# Patient Record
Sex: Male | Born: 1955 | Race: Black or African American | Hispanic: No | Smoking: Never smoker
Health system: Southern US, Community
[De-identification: ages and names within clinical notes are randomized; demographics above are authoritative.]

## PROBLEM LIST (undated history)

## (undated) DIAGNOSIS — N200 Calculus of kidney: Secondary | ICD-10-CM

## (undated) DIAGNOSIS — M5136 Other intervertebral disc degeneration, lumbar region: Secondary | ICD-10-CM

## (undated) DIAGNOSIS — G894 Chronic pain syndrome: Secondary | ICD-10-CM

## (undated) DIAGNOSIS — M51369 Other intervertebral disc degeneration, lumbar region without mention of lumbar back pain or lower extremity pain: Secondary | ICD-10-CM

## (undated) DIAGNOSIS — L309 Dermatitis, unspecified: Secondary | ICD-10-CM

## (undated) DIAGNOSIS — E785 Hyperlipidemia, unspecified: Secondary | ICD-10-CM

## (undated) HISTORY — DX: Hyperlipidemia, unspecified: E78.5

## (undated) HISTORY — PX: LITHOTRIPSY: SUR834

## (undated) HISTORY — PX: CIRCUMCISION: SUR203

---

## 2010-03-24 ENCOUNTER — Emergency Department (HOSPITAL_BASED_OUTPATIENT_CLINIC_OR_DEPARTMENT_OTHER): Admission: EM | Admit: 2010-03-24 | Discharge: 2010-03-24 | Payer: Self-pay | Admitting: Emergency Medicine

## 2010-03-24 ENCOUNTER — Ambulatory Visit: Payer: Self-pay | Admitting: Diagnostic Radiology

## 2010-04-29 ENCOUNTER — Emergency Department (HOSPITAL_BASED_OUTPATIENT_CLINIC_OR_DEPARTMENT_OTHER): Admission: EM | Admit: 2010-04-29 | Discharge: 2010-04-30 | Payer: Self-pay | Admitting: Emergency Medicine

## 2010-04-30 ENCOUNTER — Ambulatory Visit: Payer: Self-pay | Admitting: Diagnostic Radiology

## 2012-05-25 ENCOUNTER — Emergency Department (HOSPITAL_BASED_OUTPATIENT_CLINIC_OR_DEPARTMENT_OTHER): Payer: Self-pay

## 2012-05-25 ENCOUNTER — Emergency Department (HOSPITAL_BASED_OUTPATIENT_CLINIC_OR_DEPARTMENT_OTHER)
Admission: EM | Admit: 2012-05-25 | Discharge: 2012-05-25 | Disposition: A | Discharge status: Home / Self Care | Payer: Self-pay | Attending: Emergency Medicine | Admitting: Emergency Medicine

## 2012-05-25 ENCOUNTER — Encounter (HOSPITAL_BASED_OUTPATIENT_CLINIC_OR_DEPARTMENT_OTHER): Payer: Self-pay | Admitting: Emergency Medicine

## 2012-05-25 DIAGNOSIS — R079 Chest pain, unspecified: Secondary | ICD-10-CM | POA: Insufficient documentation

## 2012-05-25 DIAGNOSIS — M62838 Other muscle spasm: Secondary | ICD-10-CM | POA: Insufficient documentation

## 2012-05-25 LAB — BASIC METABOLIC PANEL
CO2: 25 mEq/L (ref 19–32)
Chloride: 102 mEq/L (ref 96–112)
Glucose, Bld: 180 mg/dL — ABNORMAL HIGH (ref 70–99)

## 2012-05-25 LAB — CBC WITH DIFFERENTIAL/PLATELET
Basophils Relative: 0 % (ref 0–1)
Eosinophils Absolute: 0.2 10*3/uL (ref 0.0–0.7)
Hemoglobin: 13.9 g/dL (ref 13.0–17.0)
Lymphocytes Relative: 34 % (ref 12–46)
MCH: 30.4 pg (ref 26.0–34.0)
MCHC: 35.1 g/dL (ref 30.0–36.0)
MCV: 86.7 fL (ref 78.0–100.0)
Monocytes Absolute: 0.4 10*3/uL (ref 0.1–1.0)
Neutrophils Relative %: 55 % (ref 43–77)
Platelets: 231 10*3/uL (ref 150–400)
WBC: 6.1 10*3/uL (ref 4.0–10.5)

## 2012-05-25 LAB — TROPONIN I: Troponin I: <0.3 ng/mL (ref <0.30)

## 2012-05-25 MED ORDER — IBUPROFEN 800 MG PO TABS
800.0000 mg | ORAL_TABLET | Freq: Once | ORAL | Status: AC
  Administered 2012-05-25: 800 mg via ORAL
  Filled 2012-05-25: qty 1

## 2012-05-25 MED ORDER — CYCLOBENZAPRINE HCL 10 MG PO TABS: 10.0000 mg | ORAL_TABLET | Freq: Three times a day (TID) | ORAL | Status: AC | PRN

## 2012-05-25 MED ORDER — ASPIRIN 81 MG PO CHEW
324.0000 mg | CHEWABLE_TABLET | Freq: Once | ORAL | Status: AC
  Administered 2012-05-25: 324 mg via ORAL
  Filled 2012-05-25: qty 4

## 2012-05-25 MED ORDER — IBUPROFEN 800 MG PO TABS: 800.0000 mg | ORAL_TABLET | Freq: Three times a day (TID) | ORAL | Status: AC | PRN

## 2012-05-25 NOTE — ED Notes (Signed)
Pt c/o right sided rib pain, radiating to right scapula.  Some shortness of breath.  Pt states pain worsens with movement.  No N/V or diaphoresis.  No fever.  No cough.

## 2012-05-25 NOTE — ED Provider Notes (Signed)
History     CSN: [id number]  Arrival date & time 05/25/12  4098   First MD Initiated Contact with Patient 05/25/12 0940      Chief Complaint  Patient presents with  . Chest Pain  . Shortness of Breath    (Consider location/radiation/quality/duration/timing/severity/associated sxs/prior treatment) HPI Pt reports about a week of moderate aching R sided chest pain, radiating around to R scapula and R neck, no known injury. No SOB, nausea or diaphoresis. No cough or fever. No PE or CAD risk factors.   History reviewed. No pertinent past medical history.  History reviewed. No pertinent past surgical history.  History reviewed. No pertinent family history.  History  Substance Use Topics  . Smoking status: Never Smoker   . Smokeless tobacco: Not on file  . Alcohol Use:       Review of Systems All other systems reviewed and are negative except as noted in HPI.   Allergies  Vicodin  Home Medications  No current outpatient prescriptions on file.  BP 140/74  Pulse 68  Resp 20  Ht 5\' 9"  (1.753 m)  Wt 190 lb (86.183 kg)  BMI 28.06 kg/m2  SpO2 99%  Physical Exam  Nursing note and vitals reviewed. Constitutional: He is oriented to person, place, and time. He appears well-developed and well-nourished.  HENT:  Head: Normocephalic and atraumatic.  Eyes: EOM are normal. Pupils are equal, round, and reactive to light.  Neck: Normal range of motion. Neck supple.  Cardiovascular: Normal rate, normal heart sounds and intact distal pulses.   Pulmonary/Chest: Effort normal and breath sounds normal. He exhibits tenderness.    Abdominal: Bowel sounds are normal. He exhibits no distension. There is no tenderness.  Musculoskeletal: Normal range of motion. He exhibits no edema and no tenderness.  Neurological: He is alert and oriented to person, place, and time. He has normal strength. No cranial nerve deficit or sensory deficit.  Skin: Skin is warm and dry. No rash noted.    Psychiatric: He has a normal mood and affect.    ED Course  Procedures (including critical care time)   Labs Reviewed  CBC WITH DIFFERENTIAL  BASIC METABOLIC PANEL  TROPONIN I   No results found.   No diagnosis found.    MDM   Date: 05/25/2012  Rate: 65  Rhythm: sinus  QRS Axis: normal  Intervals: normal  ST/T Wave abnormalities: nonspecific T wave changes  Conduction Disutrbances:first-degree A-V block   Narrative Interpretation:   Old EKG Reviewed: none available  11:23 AM Pt pain returned and is now clearly related to muscle spasm in his R trapezius. 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 Suspect this is an over use spasm. No concern for cardiac or pulmonary process.         [name] B. Bernette Mayers, MD 05/25/12 1124

## 2014-04-14 ENCOUNTER — Encounter (HOSPITAL_BASED_OUTPATIENT_CLINIC_OR_DEPARTMENT_OTHER): Payer: Self-pay | Admitting: Emergency Medicine

## 2014-04-14 ENCOUNTER — Emergency Department (HOSPITAL_BASED_OUTPATIENT_CLINIC_OR_DEPARTMENT_OTHER): Payer: 59

## 2014-04-14 ENCOUNTER — Emergency Department (HOSPITAL_BASED_OUTPATIENT_CLINIC_OR_DEPARTMENT_OTHER)
Admission: EM | Admit: 2014-04-14 | Discharge: 2014-04-14 | Disposition: A | Discharge status: Home / Self Care | Payer: 59 | Attending: Emergency Medicine | Admitting: Emergency Medicine

## 2014-04-14 DIAGNOSIS — M25519 Pain in unspecified shoulder: Secondary | ICD-10-CM | POA: Insufficient documentation

## 2014-04-14 DIAGNOSIS — R52 Pain, unspecified: Secondary | ICD-10-CM | POA: Insufficient documentation

## 2014-04-14 DIAGNOSIS — M25512 Pain in left shoulder: Secondary | ICD-10-CM

## 2014-04-14 DIAGNOSIS — R209 Unspecified disturbances of skin sensation: Secondary | ICD-10-CM | POA: Insufficient documentation

## 2014-04-14 MED ORDER — HYDROMORPHONE HCL 4 MG PO TABS: 4.0000 mg | ORAL_TABLET | Freq: Four times a day (QID) | ORAL | Status: DC | PRN

## 2014-04-14 MED ORDER — NAPROXEN 500 MG PO TABS: 500.0000 mg | ORAL_TABLET | Freq: Two times a day (BID) | ORAL | Status: DC

## 2014-04-14 MED ORDER — HYDROMORPHONE HCL PF 2 MG/ML IJ SOLN
2.0000 mg | Freq: Once | INTRAMUSCULAR | Status: AC
  Administered 2014-04-14: 2 mg via INTRAMUSCULAR
  Filled 2014-04-14: qty 1
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
## 2014-04-14 NOTE — Discharge Instructions (Signed)
X-rays of the left shoulder show some degenerative changes. No fracture no dislocation. This also could be a rotator cuff injury. Wear the sling at all times for comfort. Can take off to take a shower. Take the Naprosyn on a regular basis. Take the Dilaudid as needed for additional pain. Make an appointment with sports medicine upstairs information provided. Work note provided.

## 2014-04-14 NOTE — ED Provider Notes (Signed)
CSN: [id number]     Arrival date & time 04/14/14  [id number] History   First MD Initiated Contact with Patient 04/14/14 1014     Chief Complaint  Patient presents with  . Shoulder Pain     (Consider location/radiation/quality/duration/timing/severity/associated sxs/prior Treatment) Patient is a 58 y.o. male presenting with shoulder pain. The history is provided by the patient.  Shoulder Pain Pertinent negatives include no chest pain, no abdominal pain, no headaches and no shortness of breath.   patient with acute onset of left shoulder pain last evening. Was lifting a heavy battery but did not hurt at the time started to hurt later. No direct injury to the shoulder. No past history of problems with the shoulder. Pain is 10 out of 10. Pronounced made much worse by any movement of the shoulder at all. Feels as if there is numbness in the upper part of the arm but no numbness to the fingers.  History reviewed. No pertinent past medical history. History reviewed. No pertinent past surgical history. No family history on file. History  Substance Use Topics  . Smoking status: Never Smoker   . Smokeless tobacco: Not on file  . Alcohol Use: No    Review of Systems  Constitutional: Negative for fever.  HENT: Negative for congestion.   Eyes: Negative for visual disturbance.  Respiratory: Negative for shortness of breath.   Cardiovascular: Negative for chest pain.  Gastrointestinal: Negative for abdominal pain.  Musculoskeletal: Positive for arthralgias. Negative for neck pain.  Skin: Negative for rash.  Neurological: Positive for numbness. Negative for headaches.  Hematological: Does not bruise/bleed easily.  Psychiatric/Behavioral: Negative for confusion.      Allergies  Vicodin  Home Medications   Prior to Admission medications   Medication Sig Start Date End Date Taking? Authorizing Provider  HYDROmorphone (DILAUDID) 4 MG tablet Take 1 tablet (4 mg total) by mouth every 6 (six) hours  as needed for severe pain. 04/14/14   Vanetta MuldersScott Saivon Prowse, MD  naproxen (NAPROSYN) 500 MG tablet Take 1 tablet (500 mg total) by mouth 2 (two) times daily. 04/14/14   Vanetta MuldersScott Lakeria Starkman, MD   BP 135/86  Pulse 83  Temp(Src) 98.2 F (36.8 C) (Temporal)  Resp 16  Ht 5\' 9"  (1.753 m)  Wt 186 lb (84.369 kg)  BMI 27.45 kg/m2  SpO2 97% Physical Exam  Nursing note and vitals reviewed. Constitutional: He is oriented to person, place, and time. He appears well-developed and well-nourished. No distress.  HENT:  Head: Normocephalic and atraumatic.  Mouth/Throat: Oropharynx is clear and moist.  Eyes: Conjunctivae and EOM are normal. Pupils are equal, round, and reactive to light.  Neck: Normal range of motion.  Cardiovascular: Normal rate and normal heart sounds.   No murmur heard. Pulmonary/Chest: Breath sounds normal. No respiratory distress.  Abdominal: Soft. Bowel sounds are normal. There is no tenderness.  Musculoskeletal: Normal range of motion. He exhibits no edema.  Left shoulder without evidence of dislocation. Radial pulses 2+. No numbness to the fingers. Increased pain with any kind of range of motion of the shoulder. Patient states that the shoulder feels hot but to palpation shoulder is not abnormally warm. No erythema.  Neurological: He is alert and oriented to person, place, and time. No cranial nerve deficit. He exhibits normal muscle tone. Coordination normal.  Skin: Skin is warm. No rash noted.    ED Course  Procedures (including critical care time) Labs Review Labs Reviewed - No data to display  Imaging Review Dg Shoulder Left  04/14/2014   CLINICAL DATA:  Pain.  EXAM: LEFT SHOULDER - 2+ VIEW  COMPARISON:  None.  FINDINGS: Acromioclavicular and glenohumeral degenerative change. No fracture, separation, or dislocation.  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   Electronically Signed   By: Maisie Fushomas  Register   On: 04/14/2014 10:19     EKG Interpretation None      MDM   Final  diagnoses:  Shoulder pain, acute, left    Patient's shoulder exam suggestive of rotator cuff injury or either arthritic changes in the shoulder. With inflammation. X-ray shows no fracture or dislocation. Does show some degenerative changes. This may be contributory. Patient improved with pain medicine here and a left arm sling. We'll give referral to sports medicine. For further followup. No neurovascular deficits.    Vanetta MuldersScott Julie Paolini, MD 04/14/14 1119

## 2014-06-11 ENCOUNTER — Emergency Department (HOSPITAL_BASED_OUTPATIENT_CLINIC_OR_DEPARTMENT_OTHER)
Admission: EM | Admit: 2014-06-11 | Discharge: 2014-06-11 | Disposition: A | Discharge status: Home / Self Care | Payer: 59 | Attending: Emergency Medicine | Admitting: Emergency Medicine

## 2014-06-11 ENCOUNTER — Encounter (HOSPITAL_BASED_OUTPATIENT_CLINIC_OR_DEPARTMENT_OTHER): Payer: Self-pay | Admitting: Emergency Medicine

## 2014-06-11 ENCOUNTER — Emergency Department (HOSPITAL_BASED_OUTPATIENT_CLINIC_OR_DEPARTMENT_OTHER): Payer: 59

## 2014-06-11 DIAGNOSIS — S4980XA Other specified injuries of shoulder and upper arm, unspecified arm, initial encounter: Secondary | ICD-10-CM | POA: Insufficient documentation

## 2014-06-11 DIAGNOSIS — S46909A Unspecified injury of unspecified muscle, fascia and tendon at shoulder and upper arm level, unspecified arm, initial encounter: Secondary | ICD-10-CM | POA: Diagnosis present

## 2014-06-11 DIAGNOSIS — Y9389 Activity, other specified: Secondary | ICD-10-CM | POA: Diagnosis not present

## 2014-06-11 DIAGNOSIS — S40019A Contusion of unspecified shoulder, initial encounter: Secondary | ICD-10-CM | POA: Insufficient documentation

## 2014-06-11 DIAGNOSIS — S40011A Contusion of right shoulder, initial encounter: Secondary | ICD-10-CM

## 2014-06-11 DIAGNOSIS — Y92410 Unspecified street and highway as the place of occurrence of the external cause: Secondary | ICD-10-CM | POA: Insufficient documentation

## 2014-06-11 MED ORDER — TRAMADOL HCL 50 MG PO TABS: 50.0000 mg | ORAL_TABLET | Freq: Four times a day (QID) | ORAL | Status: DC | PRN

## 2014-06-11 MED ORDER — NAPROXEN 500 MG PO TABS: 500.0000 mg | ORAL_TABLET | Freq: Two times a day (BID) | ORAL | Status: DC
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
## 2014-06-16 ENCOUNTER — Encounter: Payer: Self-pay | Admitting: Family Medicine

## 2014-06-16 ENCOUNTER — Ambulatory Visit (HOSPITAL_BASED_OUTPATIENT_CLINIC_OR_DEPARTMENT_OTHER)
Admission: RE | Admit: 2014-06-16 | Discharge: 2014-06-16 | Disposition: A | Discharge status: Home / Self Care | Payer: 59 | Source: Ambulatory Visit | Attending: Family Medicine | Admitting: Family Medicine

## 2014-06-16 ENCOUNTER — Ambulatory Visit (INDEPENDENT_AMBULATORY_CARE_PROVIDER_SITE_OTHER): Payer: 59 | Admitting: Family Medicine

## 2014-06-16 VITALS — BP 118/77 | HR 67 | Ht 69.0 in | Wt 190.0 lb

## 2014-06-16 DIAGNOSIS — R079 Chest pain, unspecified: Secondary | ICD-10-CM

## 2014-06-16 DIAGNOSIS — R071 Chest pain on breathing: Secondary | ICD-10-CM | POA: Insufficient documentation

## 2014-06-16 DIAGNOSIS — R0789 Other chest pain: Secondary | ICD-10-CM

## 2014-06-16 DIAGNOSIS — R0781 Pleurodynia: Secondary | ICD-10-CM

## 2014-06-16 DIAGNOSIS — S4992XA Unspecified injury of left shoulder and upper arm, initial encounter: Secondary | ICD-10-CM

## 2014-06-16 DIAGNOSIS — R0602 Shortness of breath: Secondary | ICD-10-CM | POA: Diagnosis not present

## 2014-06-16 DIAGNOSIS — S46909A Unspecified injury of unspecified muscle, fascia and tendon at shoulder and upper arm level, unspecified arm, initial encounter: Secondary | ICD-10-CM

## 2014-06-16 DIAGNOSIS — S4980XA Other specified injuries of shoulder and upper arm, unspecified arm, initial encounter: Secondary | ICD-10-CM

## 2014-06-16 MED ORDER — NAPROXEN 500 MG PO TABS: 500.0000 mg | ORAL_TABLET | Freq: Two times a day (BID) | ORAL | Status: DC

## 2014-06-16 MED ORDER — OXYCODONE-ACETAMINOPHEN 5-325 MG PO TABS: 1.0000 | ORAL_TABLET | Freq: Four times a day (QID) | ORAL | Status: DC | PRN

## 2014-06-16 NOTE — Patient Instructions (Addendum)
Use the sling regularly for the next couple weeks. But come out of this a couple times a day to move your shoulder and elbow a little bit. Naproxen  twice a day with food for pain and inflammation. Percocet as needed for severe pain. Follow up with me in 2 weeks for reevaluation. If not improving will consider MRI of your shoulder to assess for full thickness rotator cuff tear. Icing 15 minutes at a time 3-4 times a day. If shortness of breath worsens go to the emergency department.

## 2014-06-18 ENCOUNTER — Encounter: Payer: Self-pay | Admitting: Family Medicine

## 2014-06-18 DIAGNOSIS — R0781 Pleurodynia: Secondary | ICD-10-CM | POA: Insufficient documentation

## 2014-06-18 DIAGNOSIS — S4992XA Unspecified injury of left shoulder and upper arm, initial encounter: Secondary | ICD-10-CM | POA: Insufficient documentation

## 2014-06-18 NOTE — Assessment & Plan Note (Signed)
likely 2/2 contusion, rotator cuff strain.  Partial supraspinatus tear possible.  Start with conservative treatment.  Naproxen with percocet as needed.  Icing, sling.  ROM exercises.  F/u in 2 weeks for reevaluation.

## 2014-06-18 NOTE — Assessment & Plan Note (Signed)
chest x-rays negative for pneumothorax, rib fracture.  2/2 contusion.  Reassured.  Icing, naproxen with percocet as needed.  Discussed red flags - go to ED with worsening shortness of breath, chest pain, hemoptysis.
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
## 2014-06-29 ENCOUNTER — Encounter: Payer: Self-pay | Admitting: Family Medicine

## 2014-06-29 ENCOUNTER — Ambulatory Visit: Payer: 59 | Admitting: Family Medicine

## 2014-06-29 ENCOUNTER — Ambulatory Visit (INDEPENDENT_AMBULATORY_CARE_PROVIDER_SITE_OTHER): Payer: 59 | Admitting: Family Medicine

## 2014-06-29 VITALS — BP 126/78 | HR 73 | Ht 69.0 in | Wt 190.0 lb

## 2014-06-29 DIAGNOSIS — S46909A Unspecified injury of unspecified muscle, fascia and tendon at shoulder and upper arm level, unspecified arm, initial encounter: Secondary | ICD-10-CM

## 2014-06-29 DIAGNOSIS — Z5189 Encounter for other specified aftercare: Secondary | ICD-10-CM

## 2014-06-29 DIAGNOSIS — S4980XA Other specified injuries of shoulder and upper arm, unspecified arm, initial encounter: Secondary | ICD-10-CM

## 2014-06-29 DIAGNOSIS — S4992XD Unspecified injury of left shoulder and upper arm, subsequent encounter: Secondary | ICD-10-CM

## 2014-06-29 DIAGNOSIS — R0781 Pleurodynia: Secondary | ICD-10-CM

## 2014-06-29 DIAGNOSIS — R079 Chest pain, unspecified: Secondary | ICD-10-CM

## 2014-06-29 NOTE — Patient Instructions (Signed)
I'm concerned you have a left rotator cuff tear. Get the MRI of your shoulder - I will call you with MRI results the business day following this. Continue naproxen, icing, percocet, sling in meantime. Out of work for 4 weeks.

## 2014-06-30 ENCOUNTER — Ambulatory Visit: Payer: 59 | Admitting: Family Medicine

## 2014-06-30 ENCOUNTER — Ambulatory Visit
Admission: RE | Admit: 2014-06-30 | Discharge: 2014-06-30 | Disposition: A | Discharge status: Home / Self Care | Payer: 59 | Source: Ambulatory Visit | Attending: Family Medicine | Admitting: Family Medicine

## 2014-06-30 DIAGNOSIS — S4992XD Unspecified injury of left shoulder and upper arm, subsequent encounter: Secondary | ICD-10-CM

## 2014-07-01 ENCOUNTER — Encounter: Payer: Self-pay | Admitting: Family Medicine
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
## 2014-07-01 NOTE — Assessment & Plan Note (Signed)
chest x-rays negative for pneumothorax, rib fracture.  Chest/lung exam reassuring again today.  2/2 contusion.  Icing, naproxen with percocet as needed.  Discussed red flags - go to ED with worsening shortness of breath, chest pain, hemoptysis.

## 2014-07-01 NOTE — Assessment & Plan Note (Signed)
likely 2/2 contusion, rotator cuff strain.  Concerning he has not had improvement and his motion is worse than last visit.  Will go ahead with MRI to assess for rotator cuff tear.  Out of work for 4 weeks.

## 2014-07-02 NOTE — Addendum Note (Signed)
Addended by: Kathi Simpers F on: 07/02/2014 04:52 PM   Modules accepted: Orders

## 2014-07-14 ENCOUNTER — Ambulatory Visit: Payer: Self-pay | Attending: Family Medicine | Admitting: Physical Therapy

## 2014-07-14 DIAGNOSIS — M25512 Pain in left shoulder: Secondary | ICD-10-CM | POA: Insufficient documentation

## 2014-07-14 DIAGNOSIS — Z5189 Encounter for other specified aftercare: Secondary | ICD-10-CM | POA: Insufficient documentation

## 2014-07-21 ENCOUNTER — Ambulatory Visit: Payer: Self-pay | Admitting: Physical Therapy

## 2014-07-28 ENCOUNTER — Ambulatory Visit: Payer: Self-pay | Admitting: Rehabilitation

## 2014-07-30 ENCOUNTER — Ambulatory Visit: Payer: Self-pay | Admitting: Rehabilitation

## 2014-08-04 ENCOUNTER — Ambulatory Visit: Payer: Self-pay | Admitting: Rehabilitation

## 2014-08-14 ENCOUNTER — Emergency Department (HOSPITAL_BASED_OUTPATIENT_CLINIC_OR_DEPARTMENT_OTHER)
Admission: EM | Admit: 2014-08-14 | Discharge: 2014-08-14 | Disposition: A | Discharge status: Home / Self Care | Payer: No Typology Code available for payment source | Attending: Emergency Medicine | Admitting: Emergency Medicine

## 2014-08-14 ENCOUNTER — Emergency Department (HOSPITAL_BASED_OUTPATIENT_CLINIC_OR_DEPARTMENT_OTHER): Payer: No Typology Code available for payment source

## 2014-08-14 ENCOUNTER — Encounter (HOSPITAL_BASED_OUTPATIENT_CLINIC_OR_DEPARTMENT_OTHER): Payer: Self-pay | Admitting: *Deleted

## 2014-08-14 DIAGNOSIS — Y92410 Unspecified street and highway as the place of occurrence of the external cause: Secondary | ICD-10-CM | POA: Diagnosis not present

## 2014-08-14 DIAGNOSIS — S199XXA Unspecified injury of neck, initial encounter: Secondary | ICD-10-CM | POA: Insufficient documentation

## 2014-08-14 DIAGNOSIS — Z791 Long term (current) use of non-steroidal anti-inflammatories (NSAID): Secondary | ICD-10-CM | POA: Diagnosis not present

## 2014-08-14 DIAGNOSIS — R52 Pain, unspecified: Secondary | ICD-10-CM

## 2014-08-14 DIAGNOSIS — Y9389 Activity, other specified: Secondary | ICD-10-CM | POA: Diagnosis not present

## 2014-08-14 DIAGNOSIS — M542 Cervicalgia: Secondary | ICD-10-CM

## 2014-08-14 MED ORDER — IBUPROFEN 800 MG PO TABS: 800.0000 mg | ORAL_TABLET | Freq: Three times a day (TID) | ORAL | Status: AC

## 2014-08-14 MED ORDER — OXYCODONE-ACETAMINOPHEN 5-325 MG PO TABS: 1.0000 | ORAL_TABLET | Freq: Three times a day (TID) | ORAL | Status: DC | PRN

## 2014-08-14 MED ORDER — KETOROLAC TROMETHAMINE 30 MG/ML IJ SOLN
30.0000 mg | Freq: Once | INTRAMUSCULAR | Status: AC
  Administered 2014-08-14: 30 mg via INTRAMUSCULAR
  Filled 2014-08-14: qty 1

## 2014-08-14 MED ORDER — DIAZEPAM 5 MG PO TABS: 5.0000 mg | ORAL_TABLET | Freq: Two times a day (BID) | ORAL | Status: AC
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
## 2014-08-14 NOTE — Discharge Instructions (Signed)
As discussed, your evaluation tonight has been largely reassuring.  Your pain is likely due to inflammation of the muscles and soft tissue around your neck.    It is important to take all medication as directed, monitor your condition carefully, and do not hesitate to return here if you develop new, or concerning changes in your condition.

## 2015-04-25 ENCOUNTER — Emergency Department (HOSPITAL_BASED_OUTPATIENT_CLINIC_OR_DEPARTMENT_OTHER): Payer: 59

## 2015-04-25 ENCOUNTER — Emergency Department (HOSPITAL_BASED_OUTPATIENT_CLINIC_OR_DEPARTMENT_OTHER)
Admission: EM | Admit: 2015-04-25 | Discharge: 2015-04-25 | Disposition: A | Discharge status: Home / Self Care | Payer: 59 | Attending: Emergency Medicine | Admitting: Emergency Medicine

## 2015-04-25 ENCOUNTER — Encounter (HOSPITAL_BASED_OUTPATIENT_CLINIC_OR_DEPARTMENT_OTHER): Payer: Self-pay | Admitting: Emergency Medicine

## 2015-04-25 DIAGNOSIS — S43402A Unspecified sprain of left shoulder joint, initial encounter: Secondary | ICD-10-CM | POA: Diagnosis not present

## 2015-04-25 DIAGNOSIS — Y998 Other external cause status: Secondary | ICD-10-CM | POA: Diagnosis not present

## 2015-04-25 DIAGNOSIS — R52 Pain, unspecified: Secondary | ICD-10-CM

## 2015-04-25 DIAGNOSIS — Y929 Unspecified place or not applicable: Secondary | ICD-10-CM | POA: Diagnosis not present

## 2015-04-25 DIAGNOSIS — W19XXXA Unspecified fall, initial encounter: Secondary | ICD-10-CM

## 2015-04-25 DIAGNOSIS — S20212A Contusion of left front wall of thorax, initial encounter: Secondary | ICD-10-CM

## 2015-04-25 DIAGNOSIS — Y9389 Activity, other specified: Secondary | ICD-10-CM | POA: Diagnosis not present

## 2015-04-25 DIAGNOSIS — W11XXXA Fall on and from ladder, initial encounter: Secondary | ICD-10-CM | POA: Insufficient documentation

## 2015-04-25 DIAGNOSIS — S4992XA Unspecified injury of left shoulder and upper arm, initial encounter: Secondary | ICD-10-CM | POA: Diagnosis present

## 2015-04-25 MED ORDER — OXYCODONE-ACETAMINOPHEN 5-325 MG PO TABS
1.0000 | ORAL_TABLET | Freq: Once | ORAL | Status: AC
  Administered 2015-04-25: 1 via ORAL
  Filled 2015-04-25: qty 1

## 2015-04-25 MED ORDER — OXYCODONE-ACETAMINOPHEN 5-325 MG PO TABS: ORAL_TABLET | ORAL | Status: DC

## 2015-04-25 NOTE — ED Notes (Signed)
Pt in c/o L shoulder pain after falling from a ladder x 2 days ago.

## 2015-04-25 NOTE — Discharge Instructions (Signed)
Take percocet for breakthrough pain, do not drink alcohol, drive, care for children or do other critical tasks while taking percocet.  ° °It is very important that you take deep breaths to prevent lung collapse and infection. ° °Take 10 deep breaths every hour to prevent lung collapse. ° °If you develop cough, fever or shortness of breath return immediately to the emergency room. ° °Please follow with your primary care doctor in the next 2 days for a check-up. They must obtain records for further management.  ° °Do not hesitate to return to the Emergency Department for any new, worsening or concerning symptoms.  ° ° °Chest Contusion °A chest contusion is a deep bruise on your chest area. Contusions are the result of an injury that caused bleeding under the skin. A chest contusion may involve bruising of the skin, muscles, or ribs. The contusion may turn blue, purple, or yellow. Minor injuries will give you a painless contusion, but more severe contusions may stay painful and swollen for a few weeks. °CAUSES  °A contusion is usually caused by a blow, trauma, or direct force to an area of the body. °SYMPTOMS  °· Swelling and redness of the injured area. °· Discoloration of the injured area. °· Tenderness and soreness of the injured area. °· Pain. °DIAGNOSIS  °The diagnosis can be made by taking a history and performing a physical exam. An X-ray, CT scan, or MRI may be needed to determine if there were any associated injuries, such as broken bones (fractures) or internal injuries. °TREATMENT  °Often, the best treatment for a chest contusion is resting, icing, and applying cold compresses to the injured area. Deep breathing exercises may be recommended to reduce the risk of pneumonia. Over-the-counter medicines may also be recommended for pain control. °HOME CARE INSTRUCTIONS  °· Put ice on the injured area. °¨ Put ice in a plastic bag. °¨ Place a towel between your skin and the bag. °¨ Leave the ice on for 15-20  minutes, 03-04 times a day. °· Only take over-the-counter or prescription medicines as directed by your caregiver. Your caregiver may recommend avoiding anti-inflammatory medicines (aspirin, ibuprofen, and naproxen) for 48 hours because these medicines may increase bruising. °· Rest the injured area. °· Perform deep-breathing exercises as directed by your caregiver. °· Stop smoking if you smoke. °· Do not lift objects over 5 pounds (2.3 kg) for 3 days or longer if recommended by your caregiver. °SEEK IMMEDIATE MEDICAL CARE IF:  °· You have increased bruising or swelling. °· You have pain that is getting worse. °· You have difficulty breathing. °· You have dizziness, weakness, or fainting. °· You have blood in your urine or stool. °· You cough up or vomit blood. °· Your swelling or pain is not relieved with medicines. °MAKE SURE YOU:  °· Understand these instructions. °· Will watch your condition. °· Will get help right away if you are not doing well or get worse. °Document Released: 06/20/2001 Document Revised: 06/19/2012 Document Reviewed: 03/18/2012 °[employer]® Patient Information ©2015 [employer]. This information is not intended to replace advice given to you by your health care provider. Make sure you discuss any questions you have with your health care provider. ° ° °

## 2015-04-25 NOTE — ED Provider Notes (Signed)
CSN: [id number]     Arrival date & time 04/25/15  1633 History   First MD Initiated Contact with Patient 04/25/15 1734     Chief Complaint  Patient presents with  . Shoulder Pain  . Fall     (Consider location/radiation/quality/duration/timing/severity/associated sxs/prior Treatment) HPI   Blood pressure 134/73, pulse 76, temperature 98.3 F (36.8 C), temperature source Oral, resp. rate 18, height 5\' 9"  (1.753 m), weight 180 lb (81.647 kg), SpO2 100 %.  Stuart Hubbard is a 59 y.o. male complaining of left shoulder and left rib pain status post fall off a ladder 2 days ago. Patient states his feet were at approximately 5 feet when he slipped and fell off a ladder, he was inside he fell onto a wooden coffee table he denies any head trauma, LOC, cervicalgia he endorses exacerbation of pain with deep breathing, he rates his pain at 8 out of 10, no pain medication taken prior to arrival. He denies abdominal pain, difficulty ambulating.  History reviewed. No pertinent past medical history. History reviewed. No pertinent past surgical history. History reviewed. No pertinent family history. History  Substance Use Topics  . Smoking status: Never Smoker   . Smokeless tobacco: Not on file  . Alcohol Use: No    Review of Systems  10 systems reviewed and found to be negative, except as noted in the HPI.   Allergies  Tramadol and Vicodin  Home Medications   Prior to Admission medications   Medication Sig Start Date End Date Taking? Authorizing Provider  oxyCODONE-acetaminophen (PERCOCET/ROXICET) 5-325 MG per tablet 1 to 2 tabs PO q6hrs  PRN for pain 04/25/15   Joni ReiningNicole Branton Einstein, PA-C   BP 134/73 mmHg  Pulse 76  Temp(Src) 98.3 F (36.8 C) (Oral)  Resp 18  Ht 5\' 9"  (1.753 m)  Wt 180 lb (81.647 kg)  BMI 26.57 kg/m2  SpO2 100% Physical Exam  Constitutional: He is oriented to person, place, and time. He appears well-developed and well-nourished. No distress.  HENT:  Head:  Normocephalic.  Eyes: Conjunctivae and EOM are normal.  Cardiovascular: Normal rate.   Pulmonary/Chest: Effort normal and breath sounds normal. No stridor. No respiratory distress. He has no wheezes. He has no rales. He exhibits tenderness.  Abdominal: Soft. Bowel sounds are normal. He exhibits no distension and no mass. There is no tenderness. There is no rebound and no guarding.  Musculoskeletal: Normal range of motion.       Arms: Pt with point tenderness to palpation on the axillary line on the left side, no crepitance, lung sounds are clear to auscultation, patient is also tender to palpation along the left triceps, full range of motion to shoulder, distally neurovascularly intact. Drop arm negative.  Neurological: He is alert and oriented to person, place, and time.  Psychiatric: He has a normal mood and affect.  Nursing note and vitals reviewed.   ED Course  Procedures (including critical care time) Labs Review Labs Reviewed - No data to display  Imaging Review Dg Ribs Unilateral W/chest Left  04/25/2015   CLINICAL DATA:  Fall from ladder 2 days ago with left-sided chest pain, initial encounter  EXAM: LEFT RIBS AND CHEST - 3+ VIEW  COMPARISON:  None.  FINDINGS: Cardiac shadow is within normal limits. The lungs are well aerated bilaterally. No pneumothorax or pleural effusion is noted. No acute rib fracture is identified.  IMPRESSION: No acute abnormality noted.   Electronically Signed   By: Alcide CleverMark  Lukens M.D.   On: 04/25/2015  17:39   Dg Shoulder Left  04/25/2015   CLINICAL DATA:  Larey Seat off ladder striking LEFT side of body on edge of a table 2 days ago, LEFT shoulder pain, LEFT lateral rib pain  EXAM: LEFT SHOULDER - 2+ VIEW  COMPARISON:  06/11/2014  FINDINGS: AC joint alignment normal.  Inferior acromial spur formation unchanged.  Glenohumeral alignment grossly normal.  No acute fracture, dislocation, or bone destruction.  Visualized LEFT ribs intact.  IMPRESSION: No acute abnormalities.    Electronically Signed   By: Ulyses Southward M.D.   On: 04/25/2015 17:37     EKG Interpretation None      MDM   Final diagnoses:  Fall, initial encounter  Rib contusion, left, initial encounter  Shoulder sprain, left, initial encounter   Filed Vitals:   04/25/15 1638  BP: 134/73  Pulse: 76  Temp: 98.3 F (36.8 C)  TempSrc: Oral  Resp: 18  Height:  (1.753 m)  Weight: 180 lb (81.647 kg)  SpO2: 100%    Medications  oxyCODONE-acetaminophen (PERCOCET/ROXICET) 5-325 MG per tablet 1 tablet (not administered)    Stuart Hubbard is a pleasant 59 y.o. male presenting with left axillary and left shoulder pain status post fall from height off a ladder 2 days ago. Patient with normal vital signs, lung sounds are clear to auscultation, rib series if shoulder negative. Patient will be given incentive spirometer, aggressive pain control with Percocet (patient states he has an allergy to Vicodin and tramadol but has had oxycodone in the past without an issue. We've had a specific discussion of return precautions for pneumonia and patient is to follow with his primary care in the next week.  Evaluation does not show pathology that would require ongoing emergent intervention or inpatient treatment. Pt is hemodynamically stable and mentating appropriately. Discussed findings and plan with patient/guardian, who agrees with care plan. All questions answered. Return precautions discussed and outpatient follow up given.   New Prescriptions   OXYCODONE-ACETAMINOPHEN (PERCOCET/ROXICET) 5-325 MG PER TABLET    1 to 2 tabs PO q6hrs  PRN for pain       Wynetta Emery, PA-C 04/25/15 1823  Doug Sou, MD 04/26/15 [phone]

## 2015-10-08 ENCOUNTER — Emergency Department (HOSPITAL_BASED_OUTPATIENT_CLINIC_OR_DEPARTMENT_OTHER): Payer: 59

## 2015-10-08 ENCOUNTER — Encounter (HOSPITAL_BASED_OUTPATIENT_CLINIC_OR_DEPARTMENT_OTHER): Payer: Self-pay | Admitting: *Deleted

## 2015-10-08 ENCOUNTER — Emergency Department (HOSPITAL_BASED_OUTPATIENT_CLINIC_OR_DEPARTMENT_OTHER)
Admission: EM | Admit: 2015-10-08 | Discharge: 2015-10-08 | Disposition: A | Discharge status: Home / Self Care | Payer: 59 | Attending: Emergency Medicine | Admitting: Emergency Medicine

## 2015-10-08 DIAGNOSIS — Z87442 Personal history of urinary calculi: Secondary | ICD-10-CM | POA: Diagnosis not present

## 2015-10-08 DIAGNOSIS — R350 Frequency of micturition: Secondary | ICD-10-CM | POA: Diagnosis not present

## 2015-10-08 DIAGNOSIS — M545 Low back pain, unspecified: Secondary | ICD-10-CM

## 2015-10-08 DIAGNOSIS — R109 Unspecified abdominal pain: Secondary | ICD-10-CM

## 2015-10-08 HISTORY — DX: Calculus of kidney: N20.0

## 2015-10-08 LAB — URINALYSIS, ROUTINE W REFLEX MICROSCOPIC
Bilirubin Urine: NEGATIVE
Glucose, UA: NEGATIVE mg/dL
Hgb urine dipstick: NEGATIVE
KETONES UR: NEGATIVE mg/dL
Leukocytes, UA: NEGATIVE
NITRITE: NEGATIVE
PH: 7 (ref 5.0–8.0)
Protein, ur: NEGATIVE mg/dL
SPECIFIC GRAVITY, URINE: 1.01 (ref 1.005–1.030)

## 2015-10-08 MED ORDER — SODIUM CHLORIDE 0.9 % IV BOLUS (SEPSIS)
500.0000 mL | Freq: Once | INTRAVENOUS | Status: AC
  Administered 2015-10-08: 500 mL via INTRAVENOUS

## 2015-10-08 MED ORDER — METHOCARBAMOL 1000 MG/10ML IJ SOLN
1000.0000 mg | Freq: Once | INTRAMUSCULAR | Status: AC
  Administered 2015-10-08: 1000 mg via INTRAMUSCULAR
  Filled 2015-10-08: qty 10

## 2015-10-08 MED ORDER — METHOCARBAMOL 500 MG PO TABS: 500.0000 mg | ORAL_TABLET | Freq: Two times a day (BID) | ORAL | Status: DC

## 2015-10-08 MED ORDER — OXYCODONE-ACETAMINOPHEN 5-325 MG PO TABS: 1.0000 | ORAL_TABLET | ORAL | Status: DC | PRN

## 2015-10-08 MED ORDER — KETOROLAC TROMETHAMINE 30 MG/ML IJ SOLN
30.0000 mg | Freq: Once | INTRAMUSCULAR | Status: AC
  Administered 2015-10-08: 30 mg via INTRAVENOUS
  Filled 2015-10-08: qty 1

## 2015-10-08 MED ORDER — METHYLPREDNISOLONE SODIUM SUCC 125 MG IJ SOLR
125.0000 mg | Freq: Once | INTRAMUSCULAR | Status: AC
  Administered 2015-10-08: 125 mg via INTRAVENOUS
  Filled 2015-10-08: qty 2

## 2015-10-08 NOTE — Discharge Instructions (Signed)

## 2015-10-08 NOTE — ED Notes (Signed)
Lower back pain. Worse with movement. No injury.

## 2015-10-08 NOTE — ED Provider Notes (Signed)
CSN: [id number]     Arrival date & time 10/08/15  1940 History  By signing my name below, I, Emmanuella Mensah, attest that this documentation has been prepared under the direction and in the presence of Margarita Grizzle, MD. Electronically Signed: Angelene Giovanni, ED Scribe. 10/08/2015. 9:00 PM.    Chief Complaint  Patient presents with  . Back Pain   Patient is a 59 y.o. male presenting with back pain. The history is provided by the patient. No language interpreter was used.  Back Pain Location:  Lumbar spine Quality:  Shooting Radiates to:  Does not radiate Pain severity:  Moderate Onset quality:  Gradual Duration:  5 days Timing:  Constant Progression:  Worsening Chronicity:  Recurrent Context: not falling, not lifting heavy objects and not recent injury   Relieved by:  Nothing Worsened by:  Nothing tried Ineffective treatments:  Narcotics Associated symptoms: no bladder incontinence, no bowel incontinence, no numbness and no weakness    HPI Comments: Eris Umali is a 59 y.o. male who presents to the Emergency Department complaining of gradually worsening constant sharp right lateral lower back pain onset 4-5 days ago. He reports associated urinary frequency. He states that the pain is worse with movement. He explains that his symptoms started out as a sharp pain in his left side but attributed that pain to trapped gas. He denies any recent falls, injuries, or trauma. He reports a hx of kidney stone approx. 12 years ago. He states that he takes oxycodone for chronic back pain but these symptoms are not related to that back pain. He reports an allergy to aspirin, tramadol, and Vicodin. He denies any hematuria, dark colored urine, bowel/bladder incontinence, numbness, or weakness.   Past Medical History  Diagnosis Date  . Kidney stones    Past Surgical History  Procedure Laterality Date  . Lithotripsy     No family history on file. Social History  Substance Use Topics  .  Smoking status: Never Smoker   . Smokeless tobacco: None  . Alcohol Use: No    Review of Systems  Gastrointestinal: Negative for bowel incontinence.  Genitourinary: Positive for frequency. Negative for bladder incontinence and hematuria.  Musculoskeletal: Positive for back pain.  Neurological: Negative for weakness and numbness.  All other systems reviewed and are negative.     Allergies  Tramadol and Vicodin  Home Medications   Prior to Admission medications   Medication Sig Start Date End Date Taking? Authorizing Provider  oxyCODONE-acetaminophen (PERCOCET/ROXICET) 5-325 MG per tablet 1 to 2 tabs PO q6hrs  PRN for pain 04/25/15   Joni Reining Pisciotta, PA-C   BP 138/77 mmHg  Pulse 83  Temp(Src) 98 F (36.7 C) (Oral)  Resp 20  Ht  (1.753 m)  Wt 190 lb (86.183 kg)  BMI 28.05 kg/m2  SpO2 100% Physical Exam  Constitutional: He is oriented to person, place, and time. He appears well-developed and well-nourished. No distress.  HENT:  Head: Normocephalic and atraumatic.  Eyes: Conjunctivae and EOM are normal.  Neck: Neck supple. No tracheal deviation present.  Cardiovascular: Normal rate.   Pulmonary/Chest: Effort normal. No respiratory distress.  Abdominal: Soft. He exhibits no distension. There is no tenderness.  Musculoskeletal: Normal range of motion.       Arms: Neurological: He is alert and oriented to person, place, and time. He displays normal reflexes. No cranial nerve deficit. He exhibits normal muscle tone. Coordination normal.  Bilateral and equal brachial and patellar and ankle reflexes.  Sensation intact and  equal bilaterally,  Skin: Skin is warm and dry.  Psychiatric: He has a normal mood and affect. His behavior is normal.  Nursing note and vitals reviewed.   ED Course  Procedures (including critical care time) DIAGNOSTIC STUDIES: Oxygen Saturation is 100% on RA, normal by my interpretation.    COORDINATION OF CARE: 8:57 PM- Pt advised of plan for  treatment and pt agrees.    Labs Review Labs Reviewed  URINALYSIS, ROUTINE W REFLEX MICROSCOPIC ([facility])    Margarita Grizzleanielle Eaven Schwager, MD has personally reviewed and evaluated these lab results as part of her medical decision-making.   MDM   Final diagnoses:  Right-sided low back pain without sciatica  I personally performed the services described in this documentation, which was scribed in my presence. The recorded information has been reviewed and considered.   Margarita Grizzleanielle Traves Majchrzak, MD 10/09/15 [phone]

## 2016-03-04 ENCOUNTER — Emergency Department (HOSPITAL_BASED_OUTPATIENT_CLINIC_OR_DEPARTMENT_OTHER)
Admission: EM | Admit: 2016-03-04 | Discharge: 2016-03-04 | Disposition: A | Discharge status: Home / Self Care | Payer: BLUE CROSS/BLUE SHIELD | Attending: Emergency Medicine | Admitting: Emergency Medicine

## 2016-03-04 ENCOUNTER — Encounter (HOSPITAL_BASED_OUTPATIENT_CLINIC_OR_DEPARTMENT_OTHER): Payer: Self-pay | Admitting: Emergency Medicine

## 2016-03-04 DIAGNOSIS — M545 Low back pain, unspecified: Secondary | ICD-10-CM

## 2016-03-04 DIAGNOSIS — Z79899 Other long term (current) drug therapy: Secondary | ICD-10-CM | POA: Insufficient documentation

## 2016-03-04 MED ORDER — IBUPROFEN 800 MG PO TABS: 800.0000 mg | ORAL_TABLET | Freq: Three times a day (TID) | ORAL | Status: DC

## 2016-03-04 MED ORDER — BACITRACIN ZINC 500 UNIT/GM EX OINT
TOPICAL_OINTMENT | Freq: Two times a day (BID) | CUTANEOUS | Status: DC
  Administered 2016-03-04: 10:00:00 via TOPICAL

## 2016-03-04 MED ORDER — IBUPROFEN 800 MG PO TABS
800.0000 mg | ORAL_TABLET | Freq: Once | ORAL | Status: AC
  Administered 2016-03-04: 800 mg via ORAL
  Filled 2016-03-04: qty 1

## 2016-03-04 NOTE — ED Provider Notes (Addendum)
CSN: [id number]     Arrival date & time 03/04/16  0912 History   First MD Initiated Contact with Patient 03/04/16 [phone]     Chief Complaint  Patient presents with  . Back Pain     (Consider location/radiation/quality/duration/timing/severity/associated sxs/prior Treatment) HPI Patient lifted a heavy rug 5 days ago. Since the event he complains of left-sided paralumbar pain radiating to left buttock. Pain is worse with walking or moving. Improved with remaining still. He is treated himself with Percocet, Aleve and Robaxin, without relief. He denies loss of bladder or bowel control. He also reports that he suffered a laceration to his left shin 5 days ago when he hit his chin on a piece of metal fall at work. No pain at shin. No other associated symptoms. No other associated symptoms.  Past Medical History  Diagnosis Date  . Kidney stones    Past Surgical History  Procedure Laterality Date  . Lithotripsy     No family history on file. Social History  Substance Use Topics  . Smoking status: Never Smoker   . Smokeless tobacco: None  . Alcohol Use: No    Review of Systems  Constitutional: Negative.   HENT: Negative.   Respiratory: Negative.   Cardiovascular: Negative.   Gastrointestinal: Negative.   Musculoskeletal: Positive for back pain.       Followed in pain management clinic for chronic back pain. [facility] database reviewed Received prescription for 75 Percocet 02/16/2016  Skin: Positive for wound.  Neurological: Negative.   Psychiatric/Behavioral: Negative.   All other systems reviewed and are negative.     Allergies  Tramadol and Vicodin  Home Medications   Prior to Admission medications   Medication Sig Start Date End Date Taking? Authorizing Provider  methocarbamol (ROBAXIN) 500 MG tablet Take 1 tablet (500 mg total) by mouth 2 (two) times daily. 10/08/15  Yes Margarita Grizzle, MD  oxyCODONE-acetaminophen (PERCOCET/ROXICET) 5-325 MG tablet Take 1 tablet by  mouth every 4 (four) hours as needed. 10/08/15  Yes Margarita Grizzle, MD   BP 125/86 mmHg  Pulse 72  Temp(Src) 98.6 F (37 C) (Oral)  Resp 20  Ht  (1.753 m)  Wt 185 lb (83.915 kg)  BMI 27.31 kg/m2  SpO2 98% Physical Exam  Constitutional: He is oriented to person, place, and time. He appears well-developed and well-nourished.  HENT:  Head: Normocephalic and atraumatic.  Eyes: Conjunctivae are normal. Pupils are equal, round, and reactive to light.  Neck: Neck supple. No tracheal deviation present. No thyromegaly present.  Cardiovascular: Normal rate and regular rhythm.   No murmur heard. Pulmonary/Chest: Effort normal and breath sounds normal.  Abdominal: Soft. Bowel sounds are normal. He exhibits no distension. There is no tenderness.  Musculoskeletal: Normal range of motion. He exhibits no edema or tenderness.  Entire spine is nontender. There is left-sided paralumbar pain when he stands up from a seated position  Neurological: He is alert and oriented to person, place, and time. No cranial nerve deficit. Coordination normal.  Gait normal motor strength 5 over 5 overall DTRs symmetric bilaterally knee jerk and ankle jerk toes downward going bilaterally  Skin: Skin is warm and dry. No rash noted.  2 cm laceration which is clean appearing at shin.  Psychiatric: He has a normal mood and affect.  Nursing note and vitals reviewed.   ED Course  Procedures (including critical care time) Labs Review Labs Reviewed - No data to display  Imaging Review No results found. I have personally  reviewed and evaluated these images and lab results as part of my medical decision-making.   EKG Interpretation None      MDM  Imaging of spine not indicated presently. Plan prescription ibuprofen. He is to follow-up with his pain management clinic if pain is not improved within the next week. Local wound care to shin. Wound patient does not require repair Patient reports last tetanus  immunization less than 10 years ago Diagnosis #1 low back pain #2 laceration to left shin Final diagnoses:  None        Doug SouSam Shayon Trompeter, MD 03/04/16 1005  Doug SouSam Zlata Alcaide, MD 03/04/16 1009

## 2016-03-04 NOTE — ED Notes (Signed)
Pt was lifting heavy wool rug with another coworker, and coworker dropped their end of rug, putting all the weight on the patient.  Pt felt his back tighten up and states pain in his lower left back has progressively worsened since.  Pain worse with movement.   Pt has been taking percocet and advil at home for pain relief.

## 2016-03-04 NOTE — Discharge Instructions (Signed)
Back Pain, Adult Wash the wound on your left shin daily with soap and water and then place a thin layer of bacitracin ointment over the wound and cover with a sterile bandage. Signs of infection include more redness around the wound, drainage from the wound, more pain, fever or not feeling well. See your doctor or return if you feel that you may be developing an infection. Take the medications prescribed for back pain. See your primary care physician or pain management clinic if pain not improved in a week Back pain is very common. The pain often gets better over time. The cause of back pain is usually not dangerous. Most people can learn to manage their back pain on their own.  HOME CARE  Watch your back pain for any changes. The following actions may help to lessen any pain you are feeling:  Stay active. Start with short walks on flat ground if you can. Try to walk farther each day.  Exercise regularly as told by your doctor. Exercise helps your back heal faster. It also helps avoid future injury by keeping your muscles strong and flexible.  Do not sit, drive, or stand in one place for more than 30 minutes.  Do not stay in bed. Resting more than 1-2 days can slow down your recovery.  Be careful when you bend or lift an object. Use good form when lifting:  Bend at your knees.  Keep the object close to your body.  Do not twist.  Sleep on a firm mattress. Lie on your side, and bend your knees. If you lie on your back, put a pillow under your knees.  Take medicines only as told by your doctor.  Put ice on the injured area.  Put ice in a plastic bag.  Place a towel between your skin and the bag.  Leave the ice on for 20 minutes, 2-3 times a day for the first 2-3 days. After that, you can switch between ice and heat packs.  Avoid feeling anxious or stressed. Find good ways to deal with stress, such as exercise.  Maintain a healthy weight. Extra weight puts stress on your back. GET  HELP IF:   You have pain that does not go away with rest or medicine.  You have worsening pain that goes down into your legs or buttocks.  You have pain that does not get better in one week.  You have pain at night.  You lose weight.  You have a fever or chills. GET HELP RIGHT AWAY IF:   You cannot control when you poop (bowel movement) or pee (urinate).  Your arms or legs feel weak.  Your arms or legs lose feeling (numbness).  You feel sick to your stomach (nauseous) or throw up (vomit).  You have belly (abdominal) pain.  You feel like you may pass out (faint).   This information is not intended to replace advice given to you by your health care provider. Make sure you discuss any questions you have with your health care provider.   Document Released: 03/13/2008 Document Revised: 10/16/2014 Document Reviewed: 01/27/2014 Elsevier Interactive Patient Education [employer]2016 Elsevier Inc.

## 2016-03-04 NOTE — ED Notes (Signed)
Pt states he cut his shins on a rusty metal plate on Monday and is unsure when his last tetanus shot was.

## 2016-05-24 ENCOUNTER — Ambulatory Visit (INDEPENDENT_AMBULATORY_CARE_PROVIDER_SITE_OTHER): Payer: BLUE CROSS/BLUE SHIELD | Admitting: Family Medicine

## 2016-05-24 VITALS — BP 122/78 | HR 73 | Temp 98.3°F | Resp 17 | Ht 68.5 in | Wt 199.0 lb

## 2016-05-24 DIAGNOSIS — Z5181 Encounter for therapeutic drug level monitoring: Secondary | ICD-10-CM

## 2016-05-24 DIAGNOSIS — M419 Scoliosis, unspecified: Secondary | ICD-10-CM

## 2016-05-24 DIAGNOSIS — G894 Chronic pain syndrome: Secondary | ICD-10-CM | POA: Diagnosis not present

## 2016-05-24 DIAGNOSIS — Z113 Encounter for screening for infections with a predominantly sexual mode of transmission: Secondary | ICD-10-CM

## 2016-05-24 DIAGNOSIS — N4889 Other specified disorders of penis: Secondary | ICD-10-CM | POA: Diagnosis not present

## 2016-05-24 DIAGNOSIS — M5137 Other intervertebral disc degeneration, lumbosacral region: Secondary | ICD-10-CM | POA: Diagnosis not present

## 2016-05-24 LAB — POCT URINALYSIS DIP (MANUAL ENTRY)
Bilirubin, UA: NEGATIVE
Glucose, UA: NEGATIVE
Ketones, POC UA: NEGATIVE
Leukocytes, UA: NEGATIVE
NITRITE UA: NEGATIVE
PROTEIN UA: NEGATIVE
RBC UA: NEGATIVE
UROBILINOGEN UA: 0.2
pH, UA: 5.5

## 2016-05-24 MED ORDER — IBUPROFEN 800 MG PO TABS: 800.0000 mg | ORAL_TABLET | Freq: Three times a day (TID) | ORAL | 0 refills | Status: DC | PRN

## 2016-05-24 MED ORDER — METHOCARBAMOL 750 MG PO TABS: 750.0000 mg | ORAL_TABLET | Freq: Four times a day (QID) | ORAL | 0 refills | Status: DC | PRN

## 2016-05-24 NOTE — Patient Instructions (Addendum)
IF you received an x-ray today, you will receive an invoice from [facility] Please contact [facility] at [phone] with questions or concerns regarding your invoice.   IF you received labwork today, you will receive an invoice from [facility]. Please contact [facility] at [phone] with questions or concerns regarding your invoice.   Our billing staff will not be able to assist you with questions regarding bills from these companies.  You will be contacted with the lab results as soon as they are available. The fastest way to get your results is to activate your My Chart account. Instructions are located on the last page of this paperwork. If you have not heard from us regarding the results in 2 weeks, please contact this office.      Chronic Back Pain  When back pain lasts longer than 3 months, it is called chronic back pain.People with chronic back pain often go through certain periods that are more intense (flare-ups).  CAUSES Chronic back pain can be caused by wear and tear (degeneration) on different structures in your back. These structures include:  The bones of your spine (vertebrae) and the joints surrounding your spinal cord and nerve roots (facets).  The strong, fibrous tissues that connect your vertebrae (ligaments). Degeneration of these structures may result in pressure on your nerves. This can lead to constant pain. HOME CARE INSTRUCTIONS  Avoid bending, heavy lifting, prolonged sitting, and activities which make the problem worse.  Take brief periods of rest throughout the day to reduce your pain. Lying down or standing usually is better than sitting while you are resting.  Take over-the-counter or prescription medicines only as directed by your caregiver. SEEK IMMEDIATE MEDICAL CARE IF:   You have weakness or numbness in one of your legs or feet.  You have trouble controlling your bladder or bowels.  You  have nausea, vomiting, abdominal pain, shortness of breath, or fainting.   This information is not intended to replace advice given to you by your health care provider. Make sure you discuss any questions you have with your health care provider.   Document Released: 11/02/2004 Document Revised: 12/18/2011 Document Reviewed: 03/15/2015 Elsevier Interactive Patient Education 2016 Elsevier Inc.  Chronic Pain Chronic pain can be defined as pain that is off and on and lasts for 3-6 months or longer. Many things cause chronic pain, which can make it difficult to make a diagnosis. There are many treatment options available for chronic pain. However, finding a treatment that works well for you may require trying various approaches until the right one is found. Many people benefit from a combination of two or more types of treatment to control their pain. SYMPTOMS  Chronic pain can occur anywhere in the body and can range from mild to very severe. Some types of chronic pain include:  Headache.  Low back pain.  Cancer pain.  Arthritis pain.  Neurogenic pain. This is pain resulting from damage to nerves. People with chronic pain may also have other symptoms such as:  Depression.  Anger.  Insomnia.  Anxiety. DIAGNOSIS  Your health care provider will help diagnose your condition over time. In many cases, the initial focus will be on excluding possible conditions that could be causing the pain. Depending on your symptoms, your health care provider may order tests to diagnose your condition. Some of these tests may include:   Blood tests.   CT scan.   MRI.   X-rays.   Ultrasounds.  Nerve conduction studies.  You may need to see a specialist.  TREATMENT  Finding treatment that works well may take time. You may be referred to a pain specialist. He or she may prescribe medicine or therapies, such as:   Mindful meditation or yoga.  Shots (injections) of numbing or  pain-relieving medicines into the spine or area of pain.  Local electrical stimulation.  Acupuncture.   Massage therapy.   Aroma, color, light, or sound therapy.   Biofeedback.   Working with a physical therapist to keep from getting stiff.   Regular, gentle exercise.   Cognitive or behavioral therapy.   Group support.  Sometimes, surgery may be recommended.  HOME CARE INSTRUCTIONS   Take all medicines as directed by your health care provider.   Lessen stress in your life by relaxing and doing things such as listening to calming music.   Exercise or be active as directed by your health care provider.   Eat a healthy diet and include things such as vegetables, fruits, fish, and lean meats in your diet.   Keep all follow-up appointments with your health care provider.   Attend a support group with others suffering from chronic pain. SEEK MEDICAL CARE IF:   Your pain gets worse.   You develop a new pain that was not there before.   You cannot tolerate medicines given to you by your health care provider.   You have new symptoms since your last visit with your health care provider.  SEEK IMMEDIATE MEDICAL CARE IF:   You feel weak.   You have decreased sensation or numbness.   You lose control of bowel or bladder function.   Your pain suddenly gets much worse.   You develop shaking.  You develop chills.  You develop confusion.  You develop chest pain.  You develop shortness of breath.  MAKE SURE YOU:  Understand these instructions.  Will watch your condition.  Will get help right away if you are not doing well or get worse.   This information is not intended to replace advice given to you by your health care provider. Make sure you discuss any questions you have with your health care provider.   Document Released: 06/17/2002 Document Revised: 05/28/2013 Document Reviewed: 03/21/2013 Elsevier Interactive Patient Education [email or web address]  Elsevier Inc.

## 2016-05-25 LAB — COMPREHENSIVE METABOLIC PANEL
ALK PHOS: 70 U/L (ref 40–115)
ALT: 23 U/L (ref 9–46)
AST: 19 U/L (ref 10–35)
Albumin: 4.4 g/dL (ref 3.6–5.1)
BUN: 15 mg/dL (ref 7–25)
CALCIUM: 9.9 mg/dL (ref 8.6–10.3)
CO2: 23 mmol/L (ref 20–31)
Chloride: 105 mmol/L (ref 98–110)
Creat: 1.15 mg/dL (ref 0.70–1.33)
Glucose, Bld: 98 mg/dL (ref 65–99)
Potassium: 4.5 mmol/L (ref 3.5–5.3)
Sodium: 138 mmol/L (ref 135–146)
TOTAL PROTEIN: 7.1 g/dL (ref 6.1–8.1)
Total Bilirubin: 0.3 mg/dL (ref 0.2–1.2)

## 2016-05-25 LAB — CBC
HEMATOCRIT: 44 % (ref 38.5–50.0)
Hemoglobin: 15 g/dL (ref 13.2–17.1)
MCH: 30.7 pg (ref 27.0–33.0)
MCHC: 34.1 g/dL (ref 32.0–36.0)
MCV: 90 fL (ref 80.0–100.0)
MPV: 8.7 fL (ref 7.5–12.5)
Platelets: 277 10*3/uL (ref 140–400)
RBC: 4.89 MIL/uL (ref 4.20–5.80)
RDW: 15 % (ref 11.0–15.0)
WBC: 6.9 10*3/uL (ref 3.8–10.8)

## 2016-05-25 LAB — TSH: TSH: 1.7 m[IU]/L (ref 0.40–4.50)

## 2016-05-25 LAB — HIV ANTIBODY (ROUTINE TESTING W REFLEX): HIV: NONREACTIVE

## 2016-05-25 LAB — HEPATITIS C ANTIBODY: HCV AB: NEGATIVE

## 2016-05-26 LAB — RPR

## 2016-05-26 LAB — GC/CHLAMYDIA PROBE AMP
CT Probe RNA: NOT DETECTED
GC PROBE AMP APTIMA: NOT DETECTED

## 2016-05-27 LAB — TRICHOMONAS VAGINALIS, PROBE AMP: TRICHOMONAS VAGINALIS PROBE APTIMA: NOT DETECTED

## 2016-05-31 NOTE — Progress Notes (Signed)
Subjective:  By signing my name below, I, Stuart Hubbard, attest that this documentation has been prepared under the direction and in the presence of Stuart SorensonEva Dea Bitting, MD. Electronically Signed: Stann Oresung-Kai Hubbard, Scribe. 05/31/2016 , 6:37 PM .  Patient was seen in Room 10 .   Patient ID: Stuart Hubbard, male    DOB: [birth year], 58 y.o.   MRN: [id number] Chief Complaint  Patient presents with  . Annual Exam   HPI Stuart Hubbard is a 60 y.o. male who [facility]. He is a new patient to our practice and plans to establish here for PCP. He has had a longer wait today than was expected so he prefers to come back at another time for his full physical and just address his chronic medical problems today. His last meal was at around 2:00~3:00PM. He agrees to blood work being done today. He has history of scoliosis, lower back pain into tail bone, and arthritis in his back. His back pain also radiates down into his right leg, but this issue has been ongoing for over a year. He hasn't had a PCP for physicals in a while. He was seen previously by an orthopedic doctor, who referred him to a pain clinic. However, he isn't seen by them anymore because he wasn't able to follow up an appointment with chiropractor. He notes that the practice he was going to, [facility] including Dr. Genella Hubbard, has closed. Patient notes his last blood work was done about a year ago. He takes diclofenac.   Patient complains having a "muscular-like" pain on the side of his penis when he urinates. He denies penile discharge, genital sores, discoloration or dysuria. He denies history of STD's.   He works as an Tree surgeonartist.   [place] [facility]S shows: he was receiving monthly prescription from Dr. Genella Hubbard, approximately 1975 percocet 5mg  every month; stopped 2 months prior in June  Past Medical History:  Diagnosis Date  . Kidney stones    Prior to Admission medications   Medication Sig Start Date End Date Taking? Authorizing  Provider  ibuprofen (ADVIL,MOTRIN) 800 MG tablet Take 1 tablet (800 mg total) by mouth 3 (three) times daily. Take with food 03/04/16  Yes Doug SouSam Jacubowitz, MD  methocarbamol (ROBAXIN) 500 MG tablet Take 1 tablet (500 mg total) by mouth 2 (two) times daily. Patient not taking: Reported on 05/24/2016 10/08/15   Margarita Grizzleanielle Ray, MD  oxyCODONE-acetaminophen (PERCOCET/ROXICET) 5-325 MG tablet Take 1 tablet by mouth every 4 (four) hours as needed. Patient not taking: Reported on 05/24/2016 10/08/15   Margarita Grizzleanielle Ray, MD   Allergies  Allergen Reactions  . Tramadol   . Vicodin [Hydrocodone-Acetaminophen] Hives    Past Surgical History:  Procedure Laterality Date  . LITHOTRIPSY     No family history on file. Social History   Social History  . Marital status: Single    Spouse name: Stuart Hubbard  . Number of children: Stuart Hubbard  . Years of education: Stuart Hubbard   Social History Main Topics  . Smoking status: Never Smoker  . Smokeless tobacco: Never Used  . Alcohol use No  . Drug use: No  . Sexual activity: No   Other Topics Concern  . None   Social History Narrative  . None    Review of Systems  Constitutional: Negative for chills, fatigue and fever.  Genitourinary: Positive for penile pain. Negative for discharge, dysuria, genital sores, hematuria and testicular pain.  Musculoskeletal: Positive for back pain and myalgias. Negative for arthralgias.  Skin: Negative for color change and rash.  Neurological: Negative for weakness and numbness.  All other systems reviewed and are negative.      Objective:   Physical Exam  Constitutional: He is oriented to person, place, and time. He appears well-developed and well-nourished. No distress.  HENT:  Head: Normocephalic and atraumatic.  Eyes: EOM are normal. Pupils are equal, round, and reactive to light.  Neck: Neck supple. No thyromegaly present.  Cardiovascular: Normal rate, regular rhythm, S1 normal, S2 normal and normal heart sounds.   No murmur  heard. Pulmonary/Chest: Effort normal and breath sounds normal. No respiratory distress.  Musculoskeletal: Normal range of motion.  Neurological: He is alert and oriented to person, place, and time.  Skin: Skin is warm and dry.  Psychiatric: He has a normal mood and affect. His behavior is normal.  Nursing note and vitals reviewed.   BP 122/78 (BP Location: Right Arm, Patient Position: Sitting, Cuff Size: Normal)   Pulse 73   Temp 98.3 F (36.8 C) (Oral)   Resp 17   Ht 5' 8.5" (1.74 m)   Wt 199 lb (90.3 kg)   SpO2 98%   BMI 29.82 kg/m     Assessment & Plan:    1. Chronic pain syndrome - due to DDD and scoliosis - it appears that his current pain management provider has resigned possibly - states he has all of his record at home and has been compliant with prior pain contracts, requests refill of nsaid and methocarbomol as well as referral to pain mngmnt - he is aware that he may need to personally ensure his records get to potential pain management specialists for review prior to his being able to establish there.  2. Penile pain   3. Screening for STD (sexually transmitted disease)   4. Medication monitoring encounter   5. Scoliosis   6. DDD (degenerative disc disease), lumbosacral    Pt is new to [facility] and here today to establish and review chronic medical conditions. He will make another appt for a CPE.  Orders Placed This Encounter  Procedures  . GC/Chlamydia Probe Amp  . Trichomonas vaginalis, RNA  . CBC  . Comprehensive metabolic panel  . TSH  . RPR  . HIV antibody  . Hepatitis C Antibody  . Ambulatory referral to Pain Clinic    Referral Priority:   Routine    Referral Type:   Consultation    Referral Reason:   Specialty Services Required    Requested Specialty:   Pain Medicine    Number of Visits Requested:   1  . POCT urinalysis dipstick    Meds ordered this encounter  Medications  . DISCONTD: methocarbamol (ROBAXIN) 750 MG tablet    Sig: Take 1 tablet  (750 mg total) by mouth every 6 (six) hours as needed for muscle spasms.    Dispense:  120 tablet    Refill:  0  . ibuprofen (ADVIL,MOTRIN) 800 MG tablet    Sig: Take 1 tablet (800 mg total) by mouth every 8 (eight) hours as needed for moderate pain. Take with food    Dispense:  90 tablet    Refill:  0  . methocarbamol (ROBAXIN) 750 MG tablet    Sig: Take 1 tablet (750 mg total) by mouth every 6 (six) hours as needed for muscle spasms.    Dispense:  120 tablet    Refill:  0    I personally performed the services described in this documentation, which was  scribed in my presence. The recorded information has been reviewed and considered, and addended by me as needed.   Stuart Sorenson, M.D.  [facility] [address] [place], [place] [address] [phone] phone [phone] fax  05/31/16 11:00 AM

## 2016-12-29 IMAGING — DX DG RIBS W/ CHEST 3+V*L*
4 series · 4 of 4 positions shown · non-contrast
Comparison: None.

CLINICAL DATA: Fall from ladder 2 days ago with left-sided chest
pain, initial encounter

EXAM:
LEFT RIBS AND CHEST - 3+ VIEW

[chest pa]
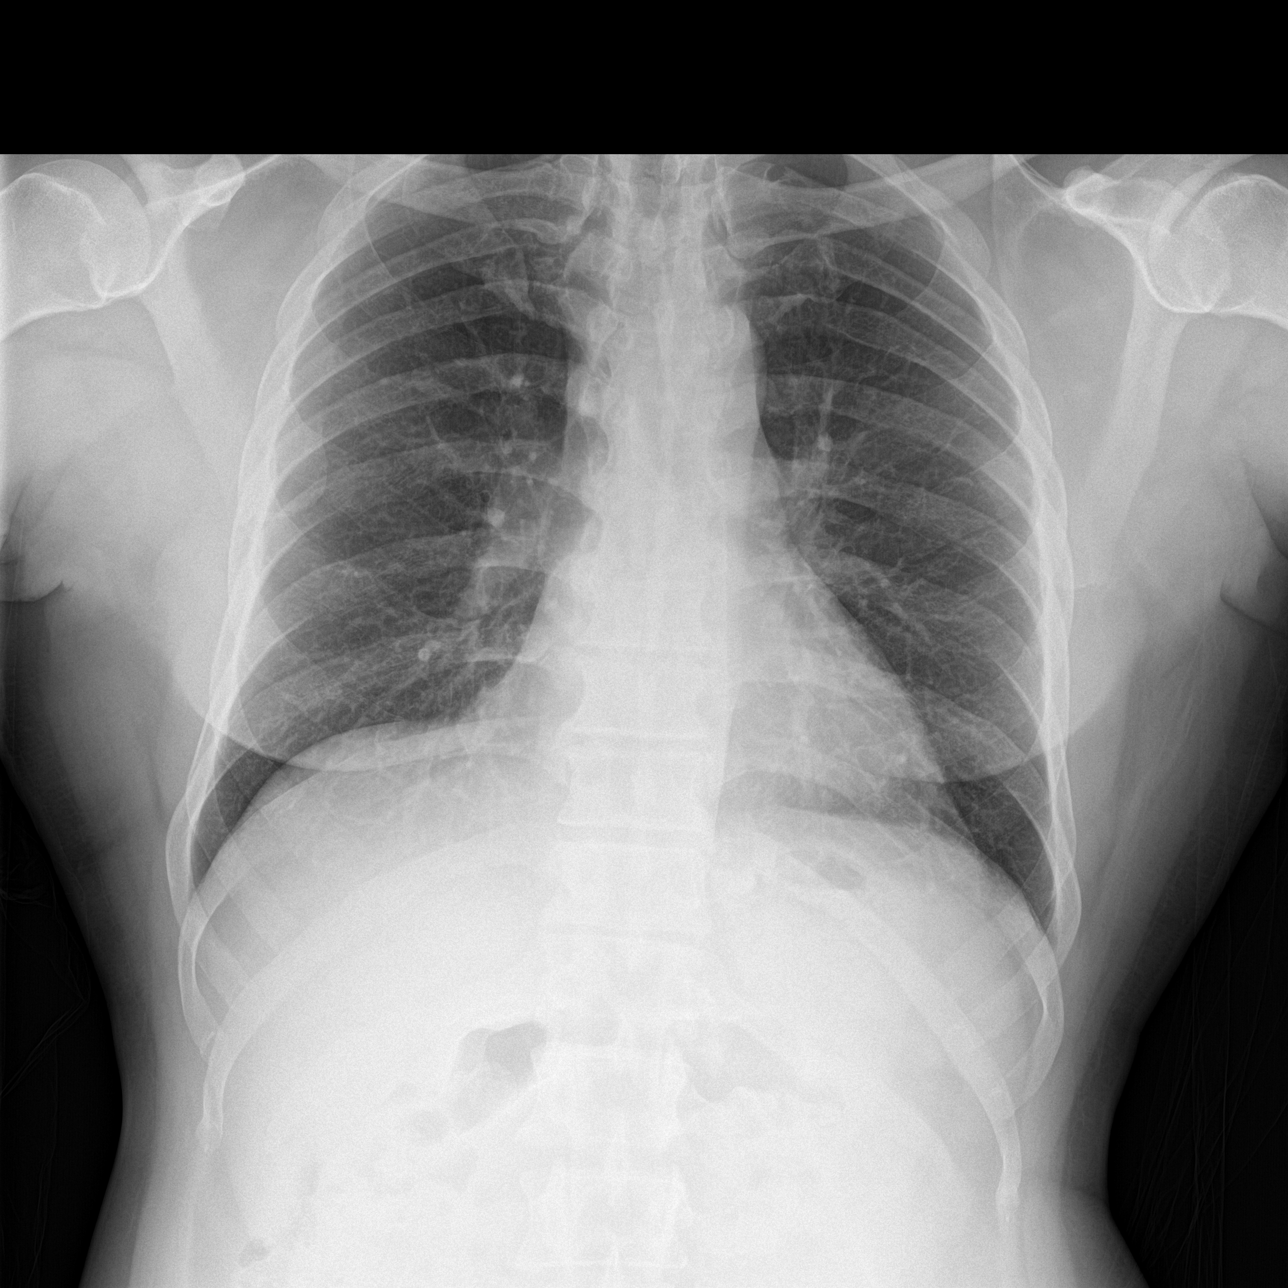

[rib pa obl (1 of 3)]
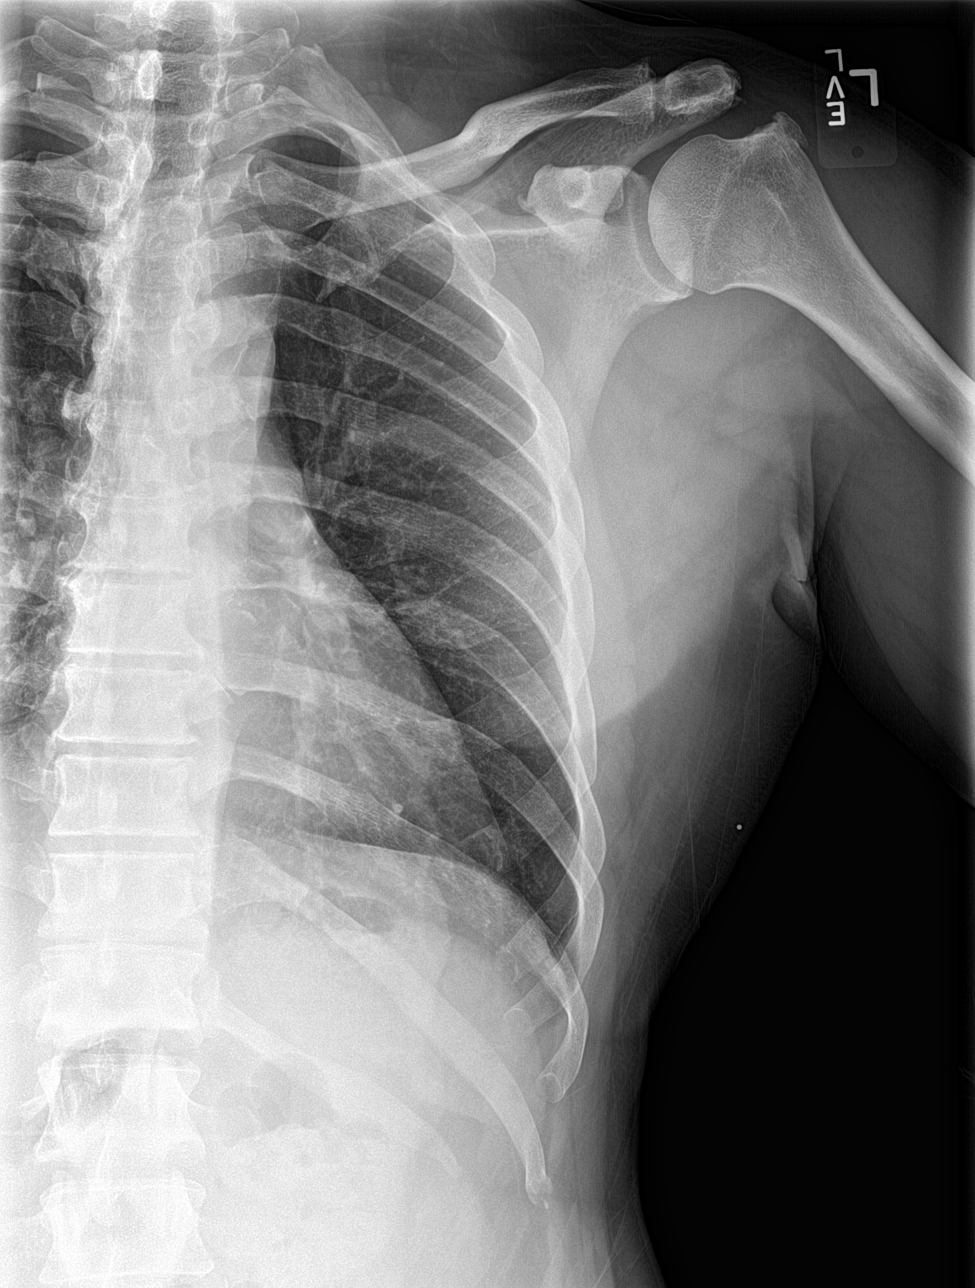

[rib pa obl (2 of 3)]
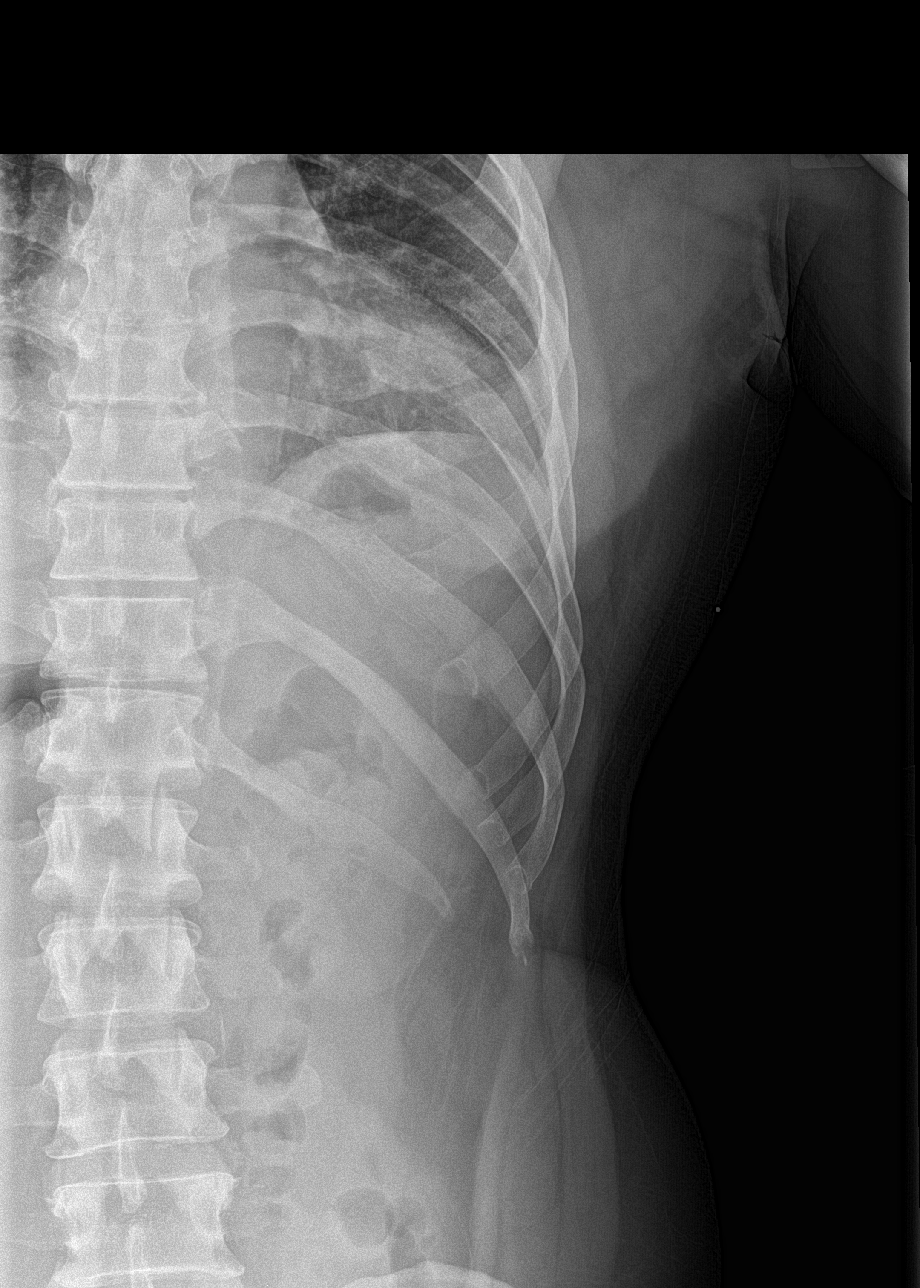

[rib pa obl (3 of 3)]
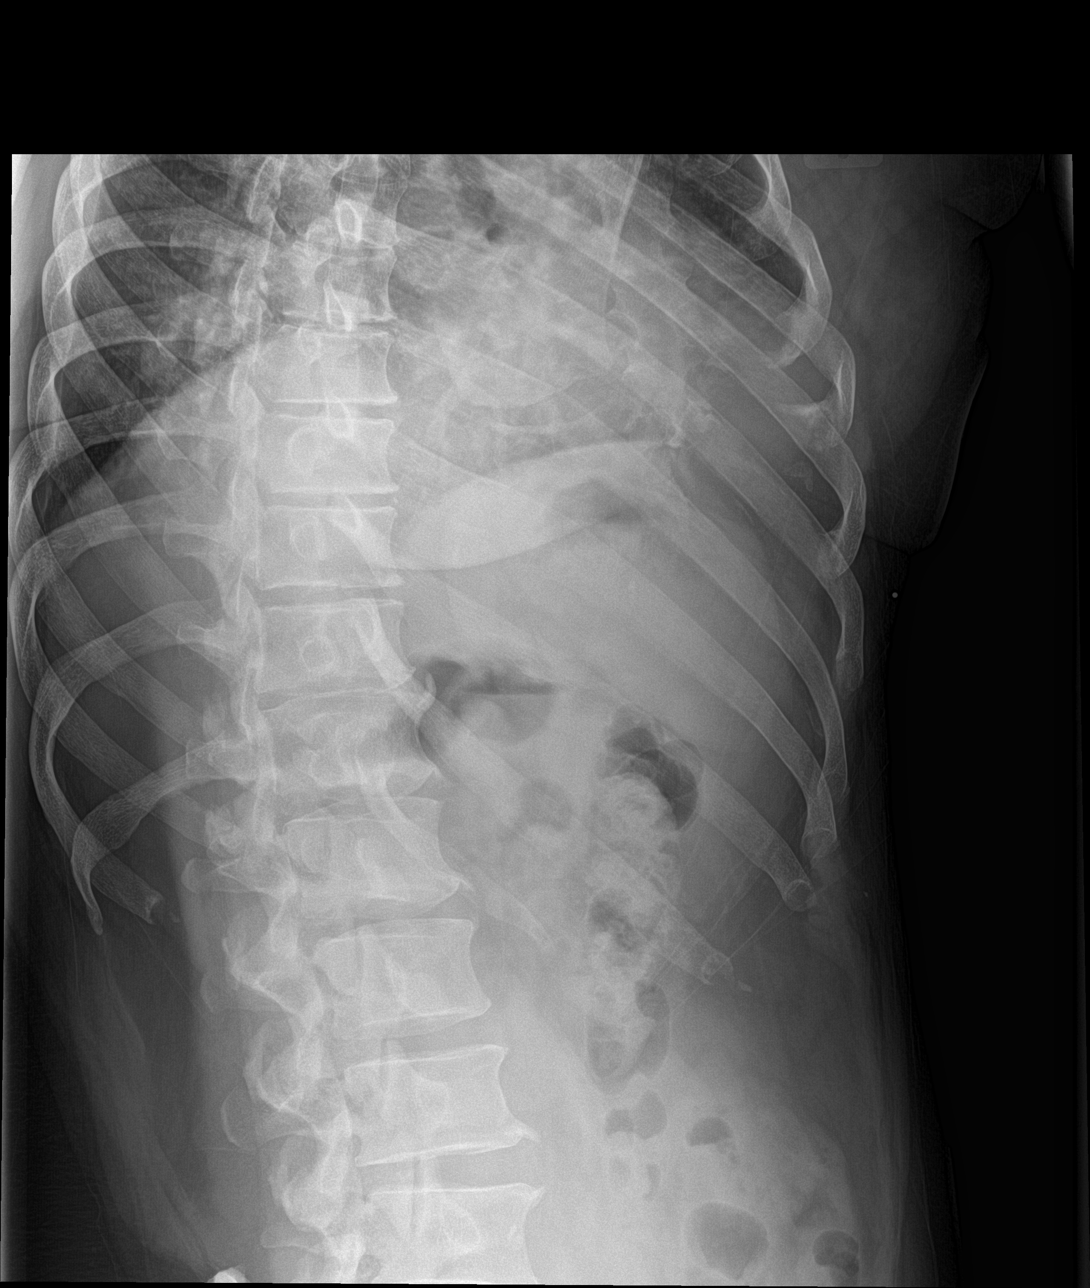

[4 of 4 positions shown; findings below may reference images not displayed]

FINDINGS: Cardiac shadow is within normal limits. The lungs are well aerated
bilaterally. No pneumothorax or pleural effusion is noted. No acute
rib fracture is identified.
IMPRESSION: No acute abnormality noted.

## 2016-12-29 IMAGING — DX DG SHOULDER 2+V*L*
3 series · 3 of 3 positions shown · non-contrast
Comparison: 06/11/2014

CLINICAL DATA: Fell off ladder striking LEFT side of body on edge
of a table 2 days ago, LEFT shoulder pain, LEFT lateral rib pain

EXAM:
LEFT SHOULDER - 2+ VIEW

[shoulder grashey]
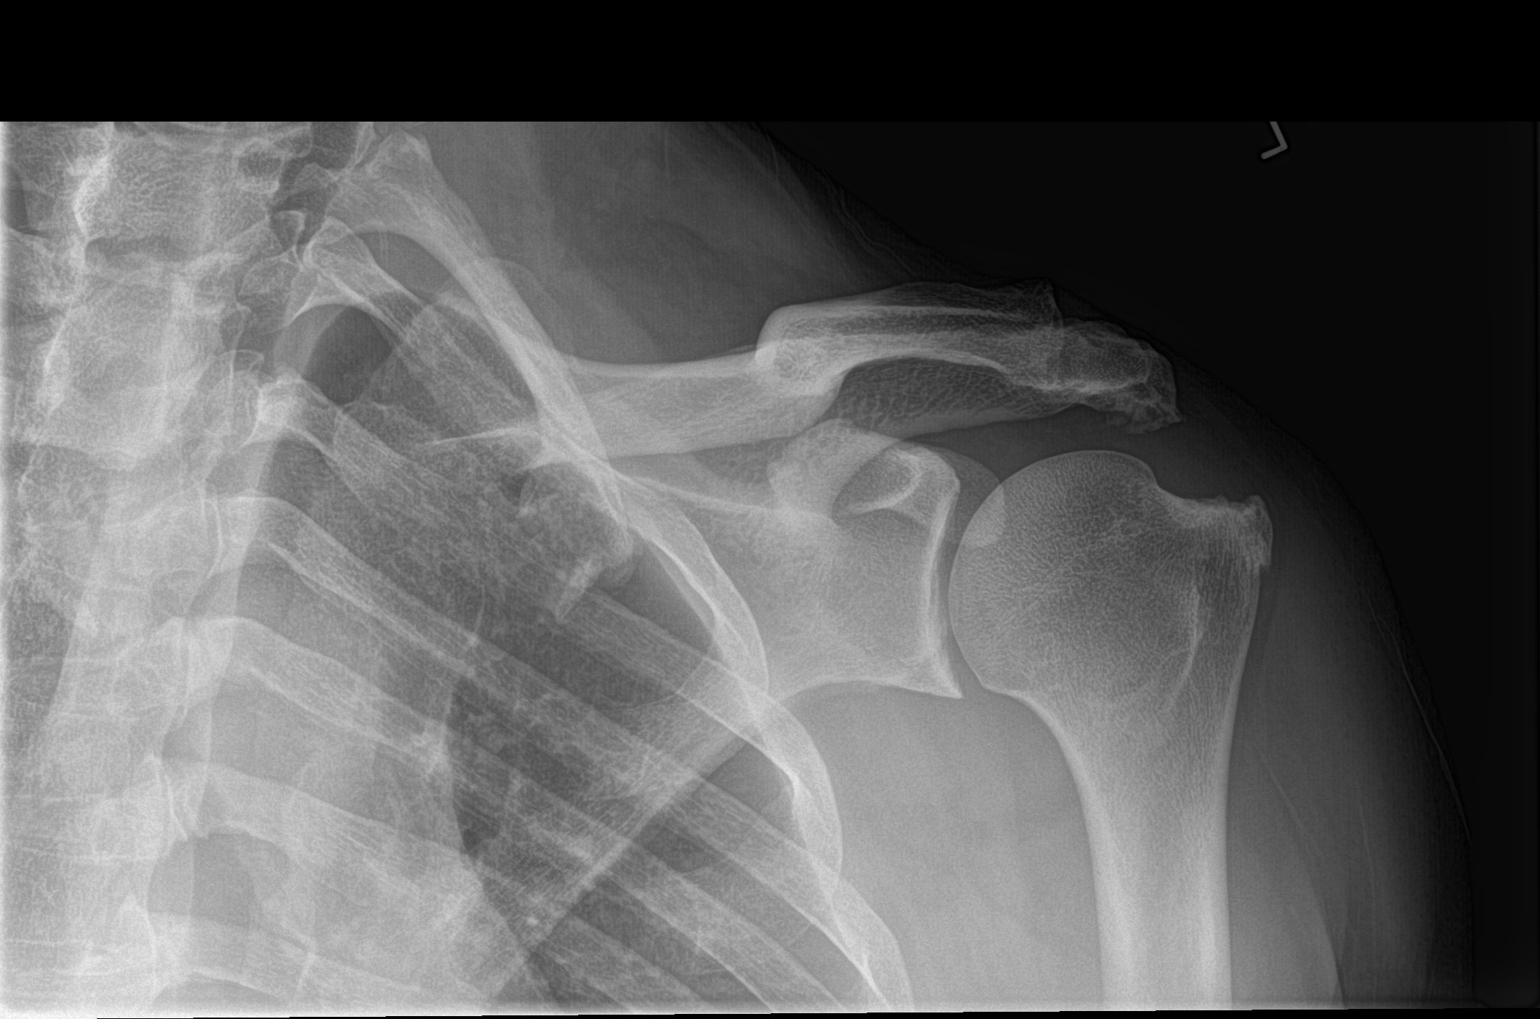

[shoulder y view]
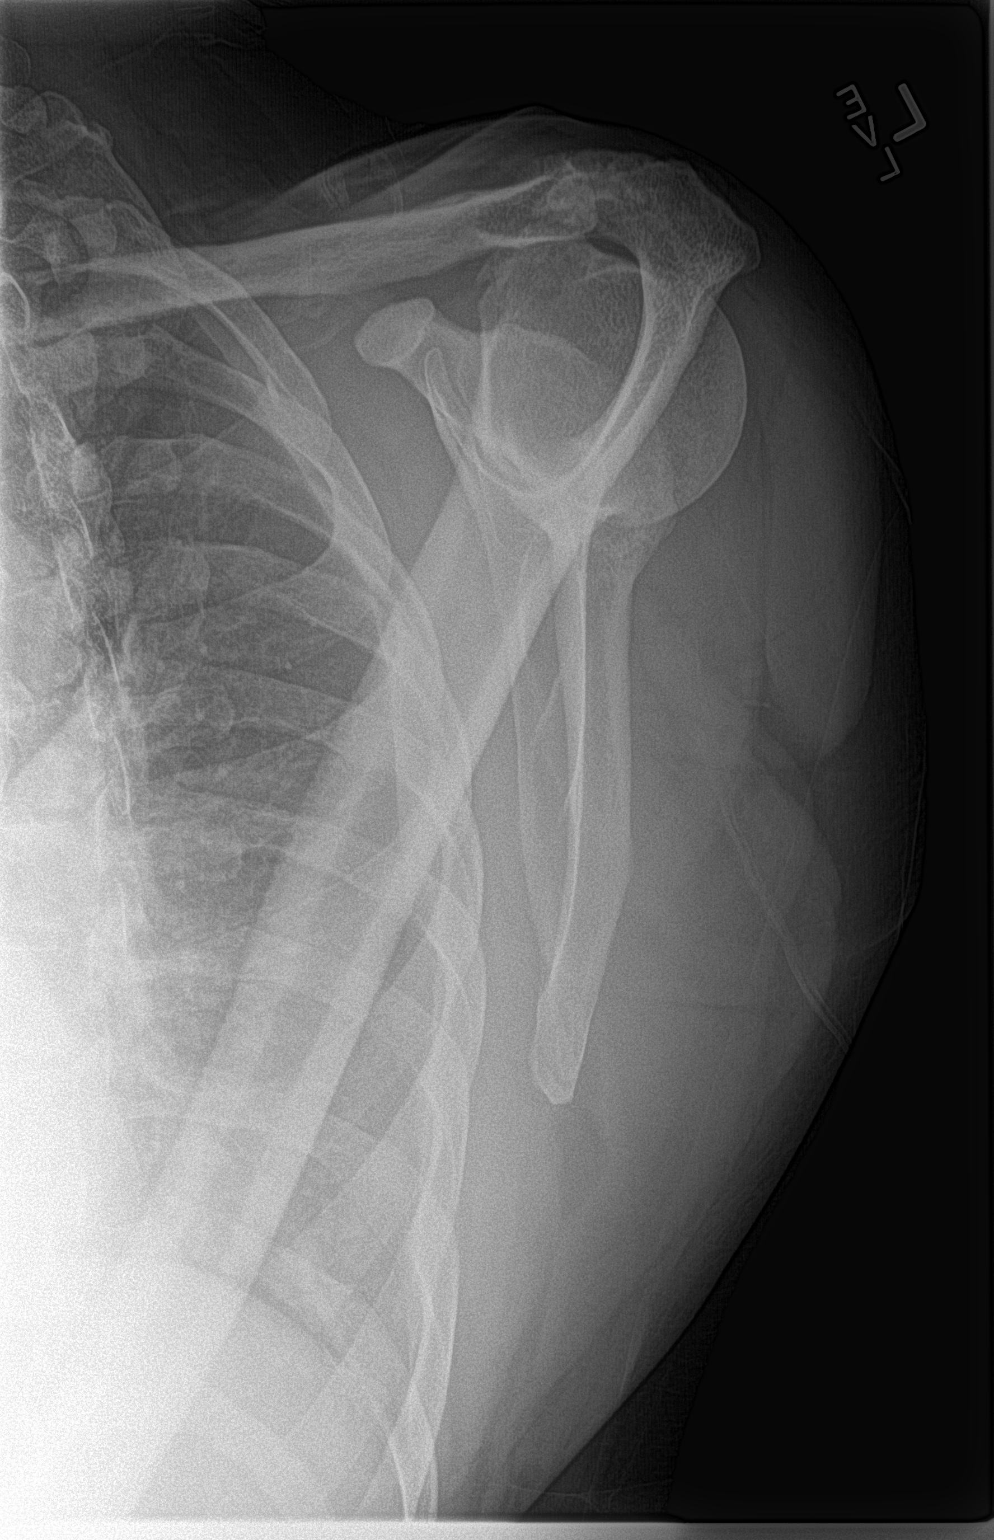

[shoulder axillary]
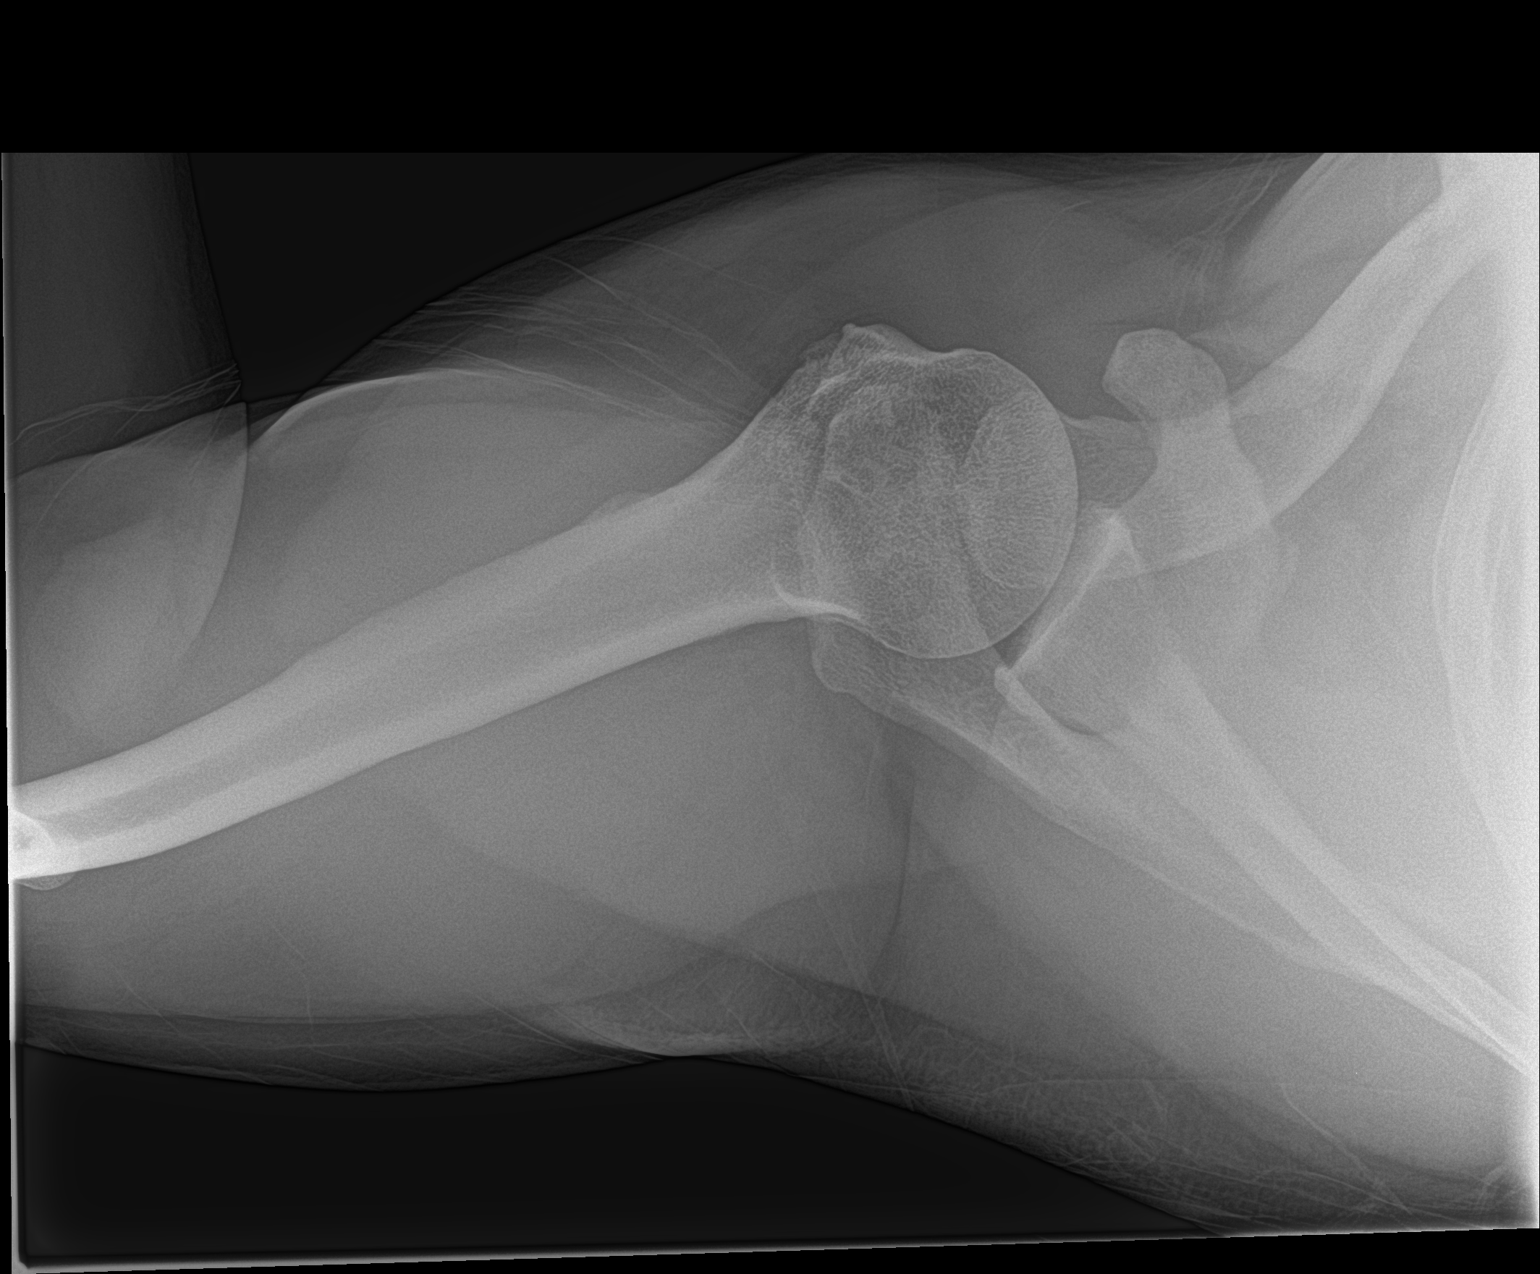

[3 of 3 positions shown; findings below may reference images not displayed]

FINDINGS: AC joint alignment normal.

Inferior acromial spur formation unchanged.

Glenohumeral alignment grossly normal.

No acute fracture, dislocation, or bone destruction.

Visualized LEFT ribs intact.
IMPRESSION: No acute abnormalities.

## 2018-01-22 ENCOUNTER — Emergency Department (HOSPITAL_BASED_OUTPATIENT_CLINIC_OR_DEPARTMENT_OTHER)
Admission: EM | Admit: 2018-01-22 | Discharge: 2018-01-22 | Disposition: A | Discharge status: Home / Self Care | Payer: BLUE CROSS/BLUE SHIELD | Attending: Emergency Medicine | Admitting: Emergency Medicine

## 2018-01-22 ENCOUNTER — Other Ambulatory Visit: Payer: Self-pay

## 2018-01-22 ENCOUNTER — Emergency Department (HOSPITAL_BASED_OUTPATIENT_CLINIC_OR_DEPARTMENT_OTHER): Payer: BLUE CROSS/BLUE SHIELD

## 2018-01-22 ENCOUNTER — Encounter (HOSPITAL_BASED_OUTPATIENT_CLINIC_OR_DEPARTMENT_OTHER): Payer: Self-pay | Admitting: Emergency Medicine

## 2018-01-22 DIAGNOSIS — X58XXXA Exposure to other specified factors, initial encounter: Secondary | ICD-10-CM | POA: Insufficient documentation

## 2018-01-22 DIAGNOSIS — Y92511 Restaurant or cafe as the place of occurrence of the external cause: Secondary | ICD-10-CM | POA: Insufficient documentation

## 2018-01-22 DIAGNOSIS — Y9389 Activity, other specified: Secondary | ICD-10-CM | POA: Insufficient documentation

## 2018-01-22 DIAGNOSIS — T189XXA Foreign body of alimentary tract, part unspecified, initial encounter: Secondary | ICD-10-CM | POA: Insufficient documentation

## 2018-01-22 DIAGNOSIS — Y999 Unspecified external cause status: Secondary | ICD-10-CM | POA: Insufficient documentation

## 2018-01-22 HISTORY — DX: Chronic pain syndrome: G89.4

## 2018-01-22 HISTORY — DX: Other intervertebral disc degeneration, lumbar region: M51.36

## 2018-01-22 HISTORY — DX: Other intervertebral disc degeneration, lumbar region without mention of lumbar back pain or lower extremity pain: M51.369

## 2018-01-22 MED ORDER — FAMOTIDINE 20 MG PO TABS
40.0000 mg | ORAL_TABLET | Freq: Once | ORAL | Status: AC
  Administered 2018-01-22: 40 mg via ORAL
  Filled 2018-01-22: qty 2

## 2018-01-22 MED ORDER — FAMOTIDINE 20 MG PO TABS: 20.0000 mg | ORAL_TABLET | Freq: Two times a day (BID) | ORAL | 0 refills | Status: DC

## 2018-01-22 MED ORDER — ONDANSETRON 4 MG PO TBDP: 4.0000 mg | ORAL_TABLET | ORAL | 0 refills | Status: DC | PRN

## 2018-01-22 MED ORDER — ONDANSETRON 4 MG PO TBDP
4.0000 mg | ORAL_TABLET | Freq: Once | ORAL | Status: AC
Start: 2018-01-22 — End: 2018-01-22
  Administered 2018-01-22: 4 mg via ORAL
  Filled 2018-01-22: qty 1

## 2018-01-22 NOTE — ED Provider Notes (Signed)
[facility] EMERGENCY DEPARTMENT Provider Note   CSN: [id number] Arrival date & time: 01/22/18  2007     History   Chief Complaint Chief Complaint  Patient presents with  . Swallowed Foreign Body    HPI Stuart Hubbard is a 62 y.o. male.  HPI Patient reports he was eating some [employer] chili at lunchtime.  He first swallowed something that seemed a little rough or unusual.  He then took another bite and bit into something really to eat.  At first he thought maybe it was a big tomato but he took it out of his mouth and it turned out to be balled up piece of plastic.  He reports ever since he ate that this afternoon, he feels like it is is sitting in his stomach.  Not had any vomiting.  He reports sometimes it feels like some is going to come back up but it does not. Past Medical History:  Diagnosis Date  . Chronic pain syndrome   . DDD (degenerative disc disease), lumbar   . Kidney stones     Patient Active Problem List   Diagnosis Date Noted  . Injury of left shoulder 06/18/2014  . Rib pain 06/18/2014    Past Surgical History:  Procedure Laterality Date  . LITHOTRIPSY          Home Medications    Prior to Admission medications   Medication Sig Start Date End Date Taking? Authorizing Provider  famotidine (PEPCID) 20 MG tablet Take 1 tablet (20 mg total) by mouth 2 (two) times daily. 01/22/18   Arby BarrettePfeiffer, Edson Deridder, MD  ibuprofen (ADVIL,MOTRIN) 800 MG tablet Take 1 tablet (800 mg total) by mouth every 8 (eight) hours as needed for moderate pain. Take with food 05/24/16   Sherren MochaShaw, Eva N, MD  methocarbamol (ROBAXIN) 750 MG tablet Take 1 tablet (750 mg total) by mouth every 6 (six) hours as needed for muscle spasms. 05/24/16   Sherren MochaShaw, Eva N, MD  ondansetron (ZOFRAN ODT) 4 MG disintegrating tablet Take 1 tablet (4 mg total) by mouth every 4 (four) hours as needed for nausea or vomiting. 01/22/18   Arby BarrettePfeiffer, Aviendha Azbell, MD  oxyCODONE-acetaminophen (PERCOCET/ROXICET) 5-325 MG tablet  Take 1 tablet by mouth every 4 (four) hours as needed. Patient not taking: Reported on 05/24/2016 10/08/15   Margarita Grizzleay, Danielle, MD    Family History History reviewed. No pertinent family history.  Social History Social History   Tobacco Use  . Smoking status: Never Smoker  . Smokeless tobacco: Never Used  Substance Use Topics  . Alcohol use: No  . Drug use: No     Allergies   Tramadol and Vicodin [hydrocodone-acetaminophen]   Review of Systems Review of Systems 10 Systems reviewed and are negative for acute change except as noted in the HPI.  Physical Exam Updated Vital Signs BP (!) 132/94 (BP Location: Left Arm)   Pulse 64   Temp 98.1 F (36.7 C)   Resp 18   Ht 5\' 9"  (1.753 m)   Wt 86.2 kg (190 lb)   SpO2 100%   BMI 28.06 kg/m   Physical Exam  Constitutional: He is oriented to person, place, and time. He appears well-developed and well-nourished. No distress.  HENT:  Head: Normocephalic and atraumatic.  Mouth/Throat: Oropharynx is clear and moist.  Eyes: EOM are normal.  Neck: Neck supple.  Cardiovascular: Normal rate, regular rhythm, normal heart sounds and intact distal pulses.  Pulmonary/Chest: Effort normal and breath sounds normal.  Abdominal: Soft. Bowel sounds  are normal. He exhibits no distension. There is no tenderness. There is no guarding.  Musculoskeletal: Normal range of motion.  Neurological: He is alert and oriented to person, place, and time. He has normal strength. Coordination normal. GCS eye subscore is 4. GCS verbal subscore is 5. GCS motor subscore is 6.  Skin: Skin is warm, dry and intact.  Psychiatric: He has a normal mood and affect.     ED Treatments / Results  Labs (all labs ordered are listed, but only abnormal results are displayed) Labs Reviewed - No data to display  EKG None  Radiology No results found.  Procedures Procedures (including critical care time)  Medications Ordered in ED Medications  famotidine (PEPCID)  tablet 40 mg (has no administration in time range)  ondansetron (ZOFRAN-ODT) disintegrating tablet 4 mg (has no administration in time range)     Initial Impression / Assessment and Plan / ED Course  I have reviewed the triage vital signs and the nursing notes.  Pertinent labs & imaging results that were available during my care of the patient were reviewed by me and considered in my medical decision making (see chart for details).      Final Clinical Impressions(s) / ED Diagnoses   Final diagnoses:  Swallowed foreign body, initial encounter  Patient had a picture on his phone of a thin piece of plastic like might be part of a bag.  At this time there is no indication that this would cause difficulty or obstruction.  Patient clinically well.  He is not having vomiting.  He is not spitting out saliva.  Abdomen is soft.  At this time will give him some Pepcid and Zofran with anticipated passage spontaneously.  Return precautions reviewed.  ED Discharge Orders        Ordered    famotidine (PEPCID) 20 MG tablet  2 times daily     01/22/18 2133    ondansetron (ZOFRAN ODT) 4 MG disintegrating tablet  Every 4 hours PRN     01/22/18 2133       Arby Barrette, MD 01/22/18 2134

## 2018-01-22 NOTE — ED Triage Notes (Addendum)
Patient states that he may have swallowed a piece of plastic earlier today. He reports that it feels like it is trying to come up out of his stomach but then "settles" back down. Patient denies any any trouble "swallowing" but reports that he feels like he is having a "glandular" reaction in his throat that is causing irritation.

## 2018-07-30 ENCOUNTER — Encounter (HOSPITAL_BASED_OUTPATIENT_CLINIC_OR_DEPARTMENT_OTHER): Payer: Self-pay | Admitting: Emergency Medicine

## 2018-07-30 ENCOUNTER — Emergency Department (HOSPITAL_BASED_OUTPATIENT_CLINIC_OR_DEPARTMENT_OTHER)
Admission: EM | Admit: 2018-07-30 | Discharge: 2018-07-30 | Disposition: A | Discharge status: Home / Self Care | Payer: Self-pay | Attending: Emergency Medicine | Admitting: Emergency Medicine

## 2018-07-30 ENCOUNTER — Other Ambulatory Visit: Payer: Self-pay

## 2018-07-30 DIAGNOSIS — L234 Allergic contact dermatitis due to dyes: Secondary | ICD-10-CM | POA: Insufficient documentation

## 2018-07-30 DIAGNOSIS — Z79899 Other long term (current) drug therapy: Secondary | ICD-10-CM | POA: Insufficient documentation

## 2018-07-30 MED ORDER — HYDROXYZINE HCL 25 MG PO TABS: 25.0000 mg | ORAL_TABLET | Freq: Four times a day (QID) | ORAL | 0 refills | Status: DC | PRN

## 2018-07-30 MED ORDER — PREDNISONE 20 MG PO TABS: ORAL_TABLET | ORAL | 0 refills | Status: DC

## 2018-07-30 MED ORDER — DEXAMETHASONE SODIUM PHOSPHATE 10 MG/ML IJ SOLN
10.0000 mg | Freq: Once | INTRAMUSCULAR | Status: AC
  Administered 2018-07-30: 10 mg via INTRAMUSCULAR
  Filled 2018-07-30: qty 1

## 2018-07-30 NOTE — ED Triage Notes (Signed)
Pt has rash all over.

## 2018-07-30 NOTE — ED Provider Notes (Signed)
[facility] EMERGENCY DEPARTMENT Provider Note   CSN: [id number] Arrival date & time: 07/30/18  0256     History   Chief Complaint Chief Complaint  Patient presents with  . Rash    HPI Stuart Hubbard is a 62 y.o. male.  Patient presents to the emergency department for evaluation of rash.  He broke out with a rash 3 weeks ago after dying his hair.  Rash started on the back of his neck, but now is over his forehead and entire hairline.  He has started having some rash on his arms and the rest of his face as well.  When it first broke out he was seen in urgent care and was given triamcinolone but it did not help.     Past Medical History:  Diagnosis Date  . Chronic pain syndrome   . DDD (degenerative disc disease), lumbar   . Kidney stones     Patient Active Problem List   Diagnosis Date Noted  . Injury of left shoulder 06/18/2014  . Rib pain 06/18/2014    Past Surgical History:  Procedure Laterality Date  . LITHOTRIPSY          Home Medications    Prior to Admission medications   Medication Sig Start Date End Date Taking? Authorizing Provider  famotidine (PEPCID) 20 MG tablet Take 1 tablet (20 mg total) by mouth 2 (two) times daily. 01/22/18   Arby Barrette, MD  hydrOXYzine (ATARAX/VISTARIL) 25 MG tablet Take 1-2 tablets (25-50 mg total) by mouth every 6 (six) hours as needed for itching. 07/30/18   Gilda Crease, MD  ibuprofen (ADVIL,MOTRIN) 800 MG tablet Take 1 tablet (800 mg total) by mouth every 8 (eight) hours as needed for moderate pain. Take with food 05/24/16   Sherren Mocha, MD  methocarbamol (ROBAXIN) 750 MG tablet Take 1 tablet (750 mg total) by mouth every 6 (six) hours as needed for muscle spasms. 05/24/16   Sherren Mocha, MD  ondansetron (ZOFRAN ODT) 4 MG disintegrating tablet Take 1 tablet (4 mg total) by mouth every 4 (four) hours as needed for nausea or vomiting. 01/22/18   Arby Barrette, MD  oxyCODONE-acetaminophen  (PERCOCET/ROXICET) 5-325 MG tablet Take 1 tablet by mouth every 4 (four) hours as needed. Patient not taking: Reported on 05/24/2016 10/08/15   Margarita Grizzle, MD  predniSONE (DELTASONE) 20 MG tablet 3 tabs po daily x 3 days, then 2 tabs x 3 days, then 1.5 tabs x 3 days, then 1 tab x 3 days, then 0.5 tabs x 3 days 07/30/18   Gilda Crease, MD    Family History No family history on file.  Social History Social History   Tobacco Use  . Smoking status: Never Smoker  . Smokeless tobacco: Never Used  Substance Use Topics  . Alcohol use: No  . Drug use: No     Allergies   Other; Tramadol; and Vicodin [hydrocodone-acetaminophen]   Review of Systems Review of Systems  Skin: Positive for rash.  All other systems reviewed and are negative.    Physical Exam Updated Vital Signs BP (!) 152/89 (BP Location: Left Arm)   Pulse 75   Temp 98.4 F (36.9 C) (Oral)   Resp 18   Ht 5\' 9"  (1.753 m)   Wt 88.5 kg   SpO2 99%   BMI 28.80 kg/m   Physical Exam  Constitutional: He is oriented to person, place, and time. He appears well-developed and well-nourished. No distress.  HENT:  Head: Normocephalic and atraumatic.  Right Ear: Hearing normal.  Left Ear: Hearing normal.  Nose: Nose normal.  Mouth/Throat: Oropharynx is clear and moist and mucous membranes are normal.  Eyes: Pupils are equal, round, and reactive to light. Conjunctivae and EOM are normal.  Neck: Normal range of motion. Neck supple.  Cardiovascular: Regular rhythm, S1 normal and S2 normal. Exam reveals no gallop and no friction rub.  No murmur heard. Pulmonary/Chest: Effort normal and breath sounds normal. No respiratory distress. He exhibits no tenderness.  Abdominal: Soft. Normal appearance and bowel sounds are normal. There is no hepatosplenomegaly. There is no tenderness. There is no rebound, no guarding, no tenderness at McBurney's point and negative Murphy's sign. No hernia.  Musculoskeletal: Normal range of  motion.  Neurological: He is alert and oriented to person, place, and time. He has normal strength. No cranial nerve deficit or sensory deficit. Coordination normal. GCS eye subscore is 4. GCS verbal subscore is 5. GCS motor subscore is 6.  Skin: Skin is warm, dry and intact. No rash noted. No cyanosis.  Cracked, thickened, dried skin at the hairline with some serosanguineous drainage present, no erythema, no induration  Psychiatric: He has a normal mood and affect. His speech is normal and behavior is normal. Thought content normal.  Nursing note and vitals reviewed.    ED Treatments / Results  Labs (all labs ordered are listed, but only abnormal results are displayed) Labs Reviewed - No data to display  EKG None  Radiology No results found.  Procedures Procedures (including critical care time)  Medications Ordered in ED Medications  dexamethasone (DECADRON) injection 10 mg (has no administration in time range)     Initial Impression / Assessment and Plan / ED Course  I have reviewed the triage vital signs and the nursing notes.  Pertinent labs & imaging results that were available during my care of the patient were reviewed by me and considered in my medical decision making (see chart for details).     Patient with significant contact dermatitis secondary to hair dye.  He has tried topical treatments without improvement.  Patient will require corticosteroids for treatment, will prescribe Vistaril for itching.  Final Clinical Impressions(s) / ED Diagnoses   Final diagnoses:  Allergic contact dermatitis due to dyes    ED Discharge Orders         Ordered    predniSONE (DELTASONE) 20 MG tablet     07/30/18 0316    hydrOXYzine (ATARAX/VISTARIL) 25 MG tablet  Every 6 hours PRN     07/30/18 0316           Gilda Crease, MD 07/30/18 [phone]

## 2018-08-09 ENCOUNTER — Emergency Department (HOSPITAL_BASED_OUTPATIENT_CLINIC_OR_DEPARTMENT_OTHER): Payer: Self-pay

## 2018-08-09 ENCOUNTER — Encounter (HOSPITAL_BASED_OUTPATIENT_CLINIC_OR_DEPARTMENT_OTHER): Payer: Self-pay | Admitting: Emergency Medicine

## 2018-08-09 ENCOUNTER — Other Ambulatory Visit: Payer: Self-pay

## 2018-08-09 ENCOUNTER — Emergency Department (HOSPITAL_BASED_OUTPATIENT_CLINIC_OR_DEPARTMENT_OTHER)
Admission: EM | Admit: 2018-08-09 | Discharge: 2018-08-10 | Disposition: A | Discharge status: Home / Self Care | Payer: Self-pay | Attending: Emergency Medicine | Admitting: Emergency Medicine

## 2018-08-09 DIAGNOSIS — Z23 Encounter for immunization: Secondary | ICD-10-CM | POA: Insufficient documentation

## 2018-08-09 DIAGNOSIS — L03221 Cellulitis of neck: Secondary | ICD-10-CM | POA: Insufficient documentation

## 2018-08-09 DIAGNOSIS — Z79899 Other long term (current) drug therapy: Secondary | ICD-10-CM | POA: Insufficient documentation

## 2018-08-09 LAB — CBC WITH DIFFERENTIAL/PLATELET
Abs Immature Granulocytes: 0.1 10*3/uL — ABNORMAL HIGH (ref 0.00–0.07)
BASOS ABS: 0 10*3/uL (ref 0.0–0.1)
BASOS PCT: 0 %
EOS ABS: 0.1 10*3/uL (ref 0.0–0.5)
EOS PCT: 0 %
HCT: 42.1 % (ref 39.0–52.0)
HEMOGLOBIN: 13.8 g/dL (ref 13.0–17.0)
Immature Granulocytes: 1 %
LYMPHS PCT: 17 %
Lymphs Abs: 2.8 10*3/uL (ref 0.7–4.0)
MCH: 29.5 pg (ref 26.0–34.0)
MCHC: 32.8 g/dL (ref 30.0–36.0)
MCV: 90 fL (ref 80.0–100.0)
MONO ABS: 1.3 10*3/uL — AB (ref 0.1–1.0)
Monocytes Relative: 8 %
NRBC: 0 % (ref 0.0–0.2)
Neutro Abs: 12.1 10*3/uL — ABNORMAL HIGH (ref 1.7–7.7)
Neutrophils Relative %: 74 %
Platelets: 293 10*3/uL (ref 150–400)
RBC: 4.68 MIL/uL (ref 4.22–5.81)
RDW: 14.3 % (ref 11.5–15.5)
WBC: 16.4 10*3/uL — AB (ref 4.0–10.5)

## 2018-08-09 LAB — BASIC METABOLIC PANEL
Anion gap: 10 (ref 5–15)
BUN: 24 mg/dL — AB (ref 6–20)
CALCIUM: 9.2 mg/dL (ref 8.9–10.3)
CHLORIDE: 97 mmol/L — AB (ref 98–111)
CO2: 24 mmol/L (ref 22–32)
CREATININE: 1.11 mg/dL (ref 0.61–1.24)
GFR calc Af Amer: >60 mL/min (ref >60)
GFR calc non Af Amer: >60 mL/min (ref >60)
Glucose, Bld: 161 mg/dL — ABNORMAL HIGH (ref 70–99)
Potassium: 3.9 mmol/L (ref 3.5–5.1)
SODIUM: 131 mmol/L — AB (ref 135–145)

## 2018-08-09 MED ORDER — DOXYCYCLINE HYCLATE 100 MG PO CAPS: 100.0000 mg | ORAL_CAPSULE | Freq: Two times a day (BID) | ORAL | 0 refills | Status: AC

## 2018-08-09 MED ORDER — TETANUS-DIPHTH-ACELL PERTUSSIS 5-2.5-18.5 LF-MCG/0.5 IM SUSP
0.5000 mL | Freq: Once | INTRAMUSCULAR | Status: AC
  Administered 2018-08-10: 0.5 mL via INTRAMUSCULAR
  Filled 2018-08-09: qty 0.5

## 2018-08-09 MED ORDER — OXYCODONE-ACETAMINOPHEN 5-325 MG PO TABS
1.0000 | ORAL_TABLET | Freq: Once | ORAL | Status: AC
  Administered 2018-08-09: 1 via ORAL
  Filled 2018-08-09: qty 1

## 2018-08-09 MED ORDER — IOPAMIDOL (ISOVUE-300) INJECTION 61%
100.0000 mL | Freq: Once | INTRAVENOUS | Status: AC
  Administered 2018-08-09: 75 mL via INTRAVENOUS

## 2018-08-09 MED ORDER — DOXYCYCLINE HYCLATE 100 MG PO TABS
100.0000 mg | ORAL_TABLET | Freq: Once | ORAL | Status: AC
  Administered 2018-08-10: 100 mg via ORAL
  Filled 2018-08-09: qty 1

## 2018-08-09 MED ORDER — CEPHALEXIN 250 MG PO CAPS
500.0000 mg | ORAL_CAPSULE | Freq: Once | ORAL | Status: AC
  Administered 2018-08-10: 500 mg via ORAL
  Filled 2018-08-09: qty 2

## 2018-08-09 MED ORDER — CEPHALEXIN 500 MG PO CAPS: 500.0000 mg | ORAL_CAPSULE | Freq: Four times a day (QID) | ORAL | 0 refills | Status: AC

## 2018-08-09 NOTE — ED Triage Notes (Signed)
Reports taking steroids for rash but states he can no longer take this due to multiple abscesses that began when taking steroids.

## 2018-08-09 NOTE — ED Provider Notes (Signed)
[facility] EMERGENCY DEPARTMENT Provider Note   CSN: [id number] Arrival date & time: 08/09/18  1931     History   Chief Complaint Chief Complaint  Patient presents with  . Abscess    HPI Stuart Hubbard is a 62 y.o. male who presents today for evaluation of low abscesses.  He reports that approximately 1 month ago he started having welts and what he felt was an allergic reaction possibly to hair dye.  He was initially treated with ketoconazole.  He presented to the emergency room on 10/22 as this was not improving where he was started on a extended prednisone taper.  Compliance with the prednisone, however has noted multiple boils over the past few days.  He has 2 on his forehead, one in front of his left ear, and a large one on the right side of his neck.  He denies any fevers.  He reports that his appetite is okay, no nausea or vomiting.  He has never had anything similar in the past.  He does report that he was squeezing the 2 on his forehead, one on his neck.    HPI  Past Medical History:  Diagnosis Date  . Chronic pain syndrome   . DDD (degenerative disc disease), lumbar   . Kidney stones     Patient Active Problem List   Diagnosis Date Noted  . Injury of left shoulder 06/18/2014  . Rib pain 06/18/2014    Past Surgical History:  Procedure Laterality Date  . LITHOTRIPSY          Home Medications    Prior to Admission medications   Medication Sig Start Date End Date Taking? Authorizing Provider  cephALEXin (KEFLEX) 500 MG capsule Take 1 capsule (500 mg total) by mouth 4 (four) times daily for 10 days. 08/09/18 08/19/18  Cristina Gong, PA-C  doxycycline (VIBRAMYCIN) 100 MG capsule Take 1 capsule (100 mg total) by mouth 2 (two) times daily for 7 days. 08/09/18 08/16/18  Cristina Gong, PA-C  famotidine (PEPCID) 20 MG tablet Take 1 tablet (20 mg total) by mouth 2 (two) times daily. 01/22/18   Arby Barrette, MD  hydrOXYzine (ATARAX/VISTARIL) 25 MG  tablet Take 1-2 tablets (25-50 mg total) by mouth every 6 (six) hours as needed for itching. 07/30/18   Gilda Crease, MD  ibuprofen (ADVIL,MOTRIN) 800 MG tablet Take 1 tablet (800 mg total) by mouth every 8 (eight) hours as needed for moderate pain. Take with food 05/24/16   Sherren Mocha, MD  methocarbamol (ROBAXIN) 750 MG tablet Take 1 tablet (750 mg total) by mouth every 6 (six) hours as needed for muscle spasms. 05/24/16   Sherren Mocha, MD  ondansetron (ZOFRAN ODT) 4 MG disintegrating tablet Take 1 tablet (4 mg total) by mouth every 4 (four) hours as needed for nausea or vomiting. 01/22/18   Arby Barrette, MD  oxyCODONE-acetaminophen (PERCOCET/ROXICET) 5-325 MG tablet Take 1 tablet by mouth every 4 (four) hours as needed. Patient not taking: Reported on 05/24/2016 10/08/15   Margarita Grizzle, MD  predniSONE (DELTASONE) 20 MG tablet 3 tabs po daily x 3 days, then 2 tabs x 3 days, then 1.5 tabs x 3 days, then 1 tab x 3 days, then 0.5 tabs x 3 days 07/30/18   Gilda Crease, MD    Family History History reviewed. No pertinent family history.  Social History Social History   Tobacco Use  . Smoking status: Never Smoker  . Smokeless tobacco: Never Used  Substance Use Topics  . Alcohol use: No  . Drug use: No     Allergies   Other; Tramadol; and Vicodin [hydrocodone-acetaminophen]   Review of Systems Review of Systems  Constitutional: Negative for chills and fever.  HENT: Positive for facial swelling.   Respiratory: Negative for chest tightness and shortness of breath.   Gastrointestinal: Negative for abdominal pain, diarrhea, nausea and vomiting.  Skin: Positive for color change and wound.  Neurological: Negative for weakness and numbness.  All other systems reviewed and are negative.    Physical Exam Updated Vital Signs BP (!) 159/83   Pulse 95   Temp 97.9 F (36.6 C) (Oral)   Resp 16   Wt 90.3 kg   SpO2 98%   BMI 29.39 kg/m   Physical Exam    Constitutional: He appears well-developed and well-nourished. No distress.  HENT:  Head: Normocephalic and atraumatic.  Multiple areas of swelling, two on the right forehead, one infront of the right ear.  Please see clinical images.   Eyes: Conjunctivae are normal. Right eye exhibits no discharge. Left eye exhibits no discharge. No scleral icterus.  Neck: Normal range of motion. Neck supple. No JVD present. No tracheal deviation present.  Large area of swelling on the left lateral neck.  There is a centralized wound.    Cardiovascular: Normal rate and regular rhythm.  Pulmonary/Chest: Effort normal. No stridor. No respiratory distress.  Abdominal: Soft. He exhibits no distension. There is no tenderness.  Musculoskeletal: He exhibits no edema or deformity.  Neurological: He is alert. He exhibits normal muscle tone.  Skin: Skin is warm and dry. He is not diaphoretic.  Psychiatric: He has a normal mood and affect. His behavior is normal.  Nursing note and vitals reviewed.            ED Treatments / Results  Labs (all labs ordered are listed, but only abnormal results are displayed) Labs Reviewed  CBC WITH DIFFERENTIAL/PLATELET - Abnormal; Notable for the following components:      Result Value   WBC 16.4 (*)    Neutro Abs 12.1 (*)    Monocytes Absolute 1.3 (*)    Abs Immature Granulocytes 0.10 (*)    All other components within normal limits  BASIC METABOLIC PANEL - Abnormal; Notable for the following components:   Sodium 131 (*)    Chloride 97 (*)    Glucose, Bld 161 (*)    BUN 24 (*)    All other components within normal limits    EKG None  Radiology Ct Soft Tissue Neck W Contrast  Result Date: 08/09/2018 CLINICAL DATA:  On steroids for rash, now with multiple abscess. EXAM: CT NECK WITH CONTRAST TECHNIQUE: Multidetector CT imaging of the neck was performed using the standard protocol following the bolus administration of intravenous contrast. CONTRAST:  75mL  ISOVUE-300 IOPAMIDOL (ISOVUE-300) INJECTION 61% COMPARISON:  CT cervical spine April 30, 2010 FINDINGS: PHARYNX AND LARYNX: Normal.  Widely patent airway. SALIVARY GLANDS: Normal. THYROID: Stable 6 mm LEFT thyroid nodule, no routine indicated follow-up by consensus. LYMPH NODES: No lymphadenopathy by CT size criteria. VASCULAR: Normal.  Patent veins. LIMITED INTRACRANIAL: Normal. VISUALIZED ORBITS: Normal. MASTOIDS AND VISUALIZED PARANASAL SINUSES: Mild paranasal sinus mucosal thickening, small LEFT maxillary and RIGHT sphenoid sinus mucosal retention cyst. SKELETON: Nonacute.  Mild degenerative change of the cervical spine. UPPER CHEST: Lung apices are clear. No superior mediastinal lymphadenopathy. OTHER: Marker overlying RIGHT lateral neck with subjacent skin thickening, subcutaneous fat stranding and trace effusion.  No focal fluid collection, subcutaneous gas or radiopaque foreign bodies. IMPRESSION: 1. RIGHT neck cellulitis without abscess. Electronically Signed   By: Awilda Metro M.D.   On: 08/09/2018 23:21    Procedures Procedures (including critical care time) EMERGENCY DEPARTMENT US SOFT TISSUE INTERPRETATION "Study: Limited Soft Tissue Ultrasound"  INDICATIONS: Pain and Soft tissue infection Multiple views of the body part were obtained in real-time with a multi-frequency linear probe  PERFORMED BY: Myself IMAGES ARCHIVED?: Yes SIDE:Right  BODY PART:Neck INTERPRETATION:  Abnormal soft tissue ultrasound.   Cellulitis vs deep abscess.     Medications Ordered in ED Medications  cephALEXin (KEFLEX) capsule 500 mg (has no administration in time range)  doxycycline (VIBRA-TABS) tablet 100 mg (has no administration in time range)  Tdap (BOOSTRIX) injection 0.5 mL (has no administration in time range)  oxyCODONE-acetaminophen (PERCOCET/ROXICET) 5-325 MG per tablet 1 tablet (1 tablet Oral Given 08/09/18 2138)  iopamidol (ISOVUE-300) 61 % injection 100 mL (75 mLs Intravenous Contrast  Given 08/09/18 2248)     Initial Impression / Assessment and Plan / ED Course  I have reviewed the triage vital signs and the nursing notes.  Pertinent labs & imaging results that were available during my care of the patient were reviewed by me and considered in my medical decision making (see chart for details).  Clinical Course as of Nov 02 0001  Fri Aug 09, 2018  2128 Dr. Ranae Palms to see patient.  Patient reports he tolerates percocet.     [EH]  2246 Has been taking prednisone  WBC(!): 16.4 [EH]    Clinical Course User Index [EH] Cristina Gong, PA-C   Patient presents today for evaluation of multiple wounds.  He has been taking prednisone recently for an allergic reaction and says that these have developed over the past few days.  He does admit to picking at his skin and attempting to squeeze the lesions.  He is afebrile, not tachycardic or tachypneic.  Bedside ultrasound was used to evaluate the large area on his right neck, which was inconclusive, without a clear drainable abscess.  CT neck was obtained with contrast showing cellulitis without evidence of drainable abscess.  Labs were obtained, white blood cell count is elevated, however patient has been taking prednisone, therefore this is expected.  CT did not show evidence of airway compromise.  As patient has multiple open wounds and does not know when his last tetanus shot was that was updated.  He was given his first dose of doxycycline and Keflex while in the department.  He was given prescriptions for both of these.  He has a primary care doctor, and was instructed to have close follow-up.  His blood pressure was mildly elevated, however suspect that this is secondary to being in the emergency room.  Is aware that he needs to follow-up with PCP.  Return precautions were discussed with patient who states their understanding.  At the time of discharge patient denied any unaddressed complaints or concerns.  Patient is  agreeable for discharge home.  Final Clinical Impressions(s) / ED Diagnoses   Final diagnoses:  Cellulitis of neck    ED Discharge Orders         Ordered    cephALEXin (KEFLEX) 500 MG capsule  4 times daily     08/09/18 2340    doxycycline (VIBRAMYCIN) 100 MG capsule  2 times daily     08/09/18 2340           Cristina Gong, [place] 08/10/18  4098    Loren Racer, MD 08/10/18 [phone]

## 2018-08-09 NOTE — Discharge Instructions (Addendum)
Do not pick, poke, press on or otherwise bother your wounds.  This will only make them worse and more swollen.    Please take Ibuprofen (Advil, motrin) and Tylenol (acetaminophen) to relieve your pain.  You may take up to 600 MG (3 pills) of normal strength ibuprofen every 8 hours as needed.  In between doses of ibuprofen you make take tylenol, up to 1,000 mg (two extra strength pills).  Do not take more than 3,000 mg tylenol in a 24 hour period.  Please check all medication labels as many medications such as pain and cold medications may contain tylenol.  Do not drink alcohol while taking these medications.  Do not take other NSAID'S while taking ibuprofen (such as aleve or naproxen).  Please take ibuprofen with food to decrease stomach upset.  You may have diarrhea from the antibiotics.  It is very important that you continue to take the antibiotics even if you get diarrhea unless a medical professional tells you that you may stop taking them.  If you stop too early the bacteria you are being treated for will become stronger and you may need different, more powerful antibiotics that have more side effects and worsening diarrhea.  Please stay well hydrated and consider probiotics as they may decrease the severity of your diarrhea.

## 2018-08-09 NOTE — ED Notes (Signed)
Patient transported to CT 

## 2018-12-05 ENCOUNTER — Encounter (HOSPITAL_BASED_OUTPATIENT_CLINIC_OR_DEPARTMENT_OTHER): Payer: Self-pay

## 2018-12-05 ENCOUNTER — Emergency Department (HOSPITAL_BASED_OUTPATIENT_CLINIC_OR_DEPARTMENT_OTHER)
Admission: EM | Admit: 2018-12-05 | Discharge: 2018-12-05 | Disposition: A | Discharge status: Home / Self Care | Payer: BLUE CROSS/BLUE SHIELD | Attending: Emergency Medicine | Admitting: Emergency Medicine

## 2018-12-05 ENCOUNTER — Other Ambulatory Visit: Payer: Self-pay

## 2018-12-05 DIAGNOSIS — Y939 Activity, unspecified: Secondary | ICD-10-CM | POA: Diagnosis not present

## 2018-12-05 DIAGNOSIS — S61212A Laceration without foreign body of right middle finger without damage to nail, initial encounter: Secondary | ICD-10-CM | POA: Diagnosis not present

## 2018-12-05 DIAGNOSIS — Y999 Unspecified external cause status: Secondary | ICD-10-CM | POA: Insufficient documentation

## 2018-12-05 DIAGNOSIS — Y929 Unspecified place or not applicable: Secondary | ICD-10-CM | POA: Diagnosis not present

## 2018-12-05 DIAGNOSIS — W270XXA Contact with workbench tool, initial encounter: Secondary | ICD-10-CM | POA: Insufficient documentation

## 2018-12-05 DIAGNOSIS — S6991XA Unspecified injury of right wrist, hand and finger(s), initial encounter: Secondary | ICD-10-CM | POA: Diagnosis present

## 2018-12-05 MED ORDER — TETANUS-DIPHTH-ACELL PERTUSSIS 5-2.5-18.5 LF-MCG/0.5 IM SUSP
0.5000 mL | Freq: Once | INTRAMUSCULAR | Status: AC
  Administered 2018-12-05: 0.5 mL via INTRAMUSCULAR
  Filled 2018-12-05: qty 0.5

## 2018-12-05 NOTE — ED Provider Notes (Signed)
[facility] EMERGENCY DEPARTMENT Provider Note   CSN: [id number] Arrival date & time: 12/05/18  1129    History   Chief Complaint Chief Complaint  Patient presents with  . Finger Injury    HPI Stuart Hubbard is a 63 y.o. male.     Pt presents to the ED today with a right middle finger lac.  He accidentally shot a screw into his right middle finger around 0830 while he was repairing an armoire.  He cleaned it right away, but it has continued to bleed.  He is not sure if it needs a stitch.  He does not know when he had his last tetanus shot.  He does not think the screw went to his bone and does not want an xray.     Past Medical History:  Diagnosis Date  . Chronic pain syndrome   . DDD (degenerative disc disease), lumbar   . Kidney stones     Patient Active Problem List   Diagnosis Date Noted  . Injury of left shoulder 06/18/2014  . Rib pain 06/18/2014    Past Surgical History:  Procedure Laterality Date  . LITHOTRIPSY          Home Medications    Prior to Admission medications   Medication Sig Start Date End Date Taking? Authorizing Provider  famotidine (PEPCID) 20 MG tablet Take 1 tablet (20 mg total) by mouth 2 (two) times daily. 01/22/18   Arby Barrette, MD  hydrOXYzine (ATARAX/VISTARIL) 25 MG tablet Take 1-2 tablets (25-50 mg total) by mouth every 6 (six) hours as needed for itching. 07/30/18   Gilda Crease, MD  ibuprofen (ADVIL,MOTRIN) 800 MG tablet Take 1 tablet (800 mg total) by mouth every 8 (eight) hours as needed for moderate pain. Take with food 05/24/16   Sherren Mocha, MD  methocarbamol (ROBAXIN) 750 MG tablet Take 1 tablet (750 mg total) by mouth every 6 (six) hours as needed for muscle spasms. 05/24/16   Sherren Mocha, MD  ondansetron (ZOFRAN ODT) 4 MG disintegrating tablet Take 1 tablet (4 mg total) by mouth every 4 (four) hours as needed for nausea or vomiting. 01/22/18   Arby Barrette, MD  oxyCODONE-acetaminophen  (PERCOCET/ROXICET) 5-325 MG tablet Take 1 tablet by mouth every 4 (four) hours as needed. Patient not taking: Reported on 05/24/2016 10/08/15   Margarita Grizzle, MD  predniSONE (DELTASONE) 20 MG tablet 3 tabs po daily x 3 days, then 2 tabs x 3 days, then 1.5 tabs x 3 days, then 1 tab x 3 days, then 0.5 tabs x 3 days 07/30/18   Gilda Crease, MD    Family History No family history on file.  Social History Social History   Tobacco Use  . Smoking status: Never Smoker  . Smokeless tobacco: Never Used  Substance Use Topics  . Alcohol use: No  . Drug use: No     Allergies   Other; Tramadol; and Vicodin [hydrocodone-acetaminophen]   Review of Systems Review of Systems  Musculoskeletal:       Right middle finger lac  All other systems reviewed and are negative.    Physical Exam Updated Vital Signs BP (!) 157/85 (BP Location: Left Arm)   Pulse 73   Temp 98.1 F (36.7 C) (Oral)   Resp 14   Ht 5\' 9"  (1.753 m)   Wt 90.7 kg   SpO2 98%   BMI 29.53 kg/m   Physical Exam Vitals signs and nursing note reviewed.  Constitutional:  Appearance: Normal appearance.  HENT:     Head: Normocephalic and atraumatic.     Right Ear: External ear normal.     Left Ear: External ear normal.     Nose: Nose normal.     Mouth/Throat:     Mouth: Mucous membranes are moist.  Eyes:     Extraocular Movements: Extraocular movements intact.     Pupils: Pupils are equal, round, and reactive to light.  Neck:     Musculoskeletal: Normal range of motion and neck supple.  Cardiovascular:     Rate and Rhythm: Normal rate and regular rhythm.     Pulses: Normal pulses.  Pulmonary:     Effort: Pulmonary effort is normal.     Breath sounds: Normal breath sounds.  Abdominal:     General: Abdomen is flat.  Musculoskeletal:     Comments: Right distal middle finger tip lac  Skin:    General: Skin is warm.     Capillary Refill: Capillary refill takes less than 2 seconds.  Neurological:      General: No focal deficit present.     Mental Status: He is alert and oriented to person, place, and time.  Psychiatric:        Mood and Affect: Mood normal.        Behavior: Behavior normal.      ED Treatments / Results  Labs (all labs ordered are listed, but only abnormal results are displayed) Labs Reviewed - No data to display  EKG None  Radiology No results found.  Procedures .Marland KitchenLaceration Repair Date/Time: 12/05/2018 12:30 PM Performed by: Jacalyn Lefevre, MD Authorized by: Jacalyn Lefevre, MD   Consent:    Consent obtained:  Verbal   Consent given by:  Patient   Risks discussed:  Infection and pain   Alternatives discussed:  No treatment Anesthesia (see MAR for exact dosages):    Anesthesia method:  None Laceration details:    Location:  Finger   Length (cm):  0.5 Repair type:    Repair type:  Simple Pre-procedure details:    Preparation:  Patient was prepped and draped in usual sterile fashion Treatment:    Area cleansed with:  Saline   Amount of cleaning:  Standard   Irrigation solution:  Sterile saline   Irrigation method:  Pressure wash Skin repair:    Repair method:  Tissue adhesive Approximation:    Approximation:  Close Post-procedure details:    Dressing:  Open (no dressing)   Patient tolerance of procedure:  Tolerated well, no immediate complications   (including critical care time)  Medications Ordered in ED Medications  Tdap (BOOSTRIX) injection 0.5 mL (0.5 mLs Intramuscular Given 12/05/18 1225)     Initial Impression / Assessment and Plan / ED Course  I have reviewed the triage vital signs and the nursing notes.  Pertinent labs & imaging results that were available during my care of the patient were reviewed by me and considered in my medical decision making (see chart for details).      Pt given a tetanus shot.  Wound irrigated and closed with dermabond.  Return if worse.    Final Clinical Impressions(s) / ED Diagnoses   Final  diagnoses:  Laceration of right middle finger without foreign body without damage to nail, initial encounter    ED Discharge Orders    None       Jacalyn Lefevre, MD 12/05/18 1232

## 2018-12-05 NOTE — ED Triage Notes (Addendum)
Pt states he "shot a screw in my finger" at ~9am- right middle finger with puncture wound/slight lac-bleeding controlled-NAD-steady gait

## 2021-09-12 ENCOUNTER — Encounter (HOSPITAL_BASED_OUTPATIENT_CLINIC_OR_DEPARTMENT_OTHER): Payer: Self-pay

## 2021-09-12 ENCOUNTER — Other Ambulatory Visit: Payer: Self-pay

## 2021-09-12 ENCOUNTER — Other Ambulatory Visit (HOSPITAL_BASED_OUTPATIENT_CLINIC_OR_DEPARTMENT_OTHER): Payer: Self-pay

## 2021-09-12 ENCOUNTER — Emergency Department (HOSPITAL_BASED_OUTPATIENT_CLINIC_OR_DEPARTMENT_OTHER)
Admission: EM | Admit: 2021-09-12 | Discharge: 2021-09-12 | Disposition: A | Discharge status: Home / Self Care | Payer: Self-pay | Attending: Emergency Medicine | Admitting: Emergency Medicine

## 2021-09-12 DIAGNOSIS — L309 Dermatitis, unspecified: Secondary | ICD-10-CM | POA: Insufficient documentation

## 2021-09-12 HISTORY — DX: Dermatitis, unspecified: L30.9

## 2021-09-12 MED ORDER — PREDNISONE 20 MG PO TABS
60.0000 mg | ORAL_TABLET | Freq: Every day | ORAL | 0 refills | Status: AC
  Filled 2021-09-12: qty 15, 5d supply, fill #0

## 2021-09-12 NOTE — ED Provider Notes (Signed)
[facility] EMERGENCY DEPARTMENT Provider Note   CSN: [id number] Arrival date & time: 09/12/21  1218     History Chief Complaint  Patient presents with   Rash    Stuart Hubbard is a 65 y.o. male.  The history is provided by the patient.  Rash Location: head, hands,chest. Severity:  Mild Onset quality:  Gradual Timing:  Constant Progression:  Unchanged Chronicity:  Recurrent Context comment:  Ezcema flaring up badly Relieved by:  Moisturizers Worsened by:  Nothing Associated symptoms: no fever and no joint pain       Past Medical History:  Diagnosis Date   Chronic pain syndrome    DDD (degenerative disc disease), lumbar    Eczema    Kidney stones     Patient Active Problem List   Diagnosis Date Noted   Injury of left shoulder 06/18/2014   Rib pain 06/18/2014    Past Surgical History:  Procedure Laterality Date   LITHOTRIPSY         No family history on file.  Social History   Tobacco Use   Smoking status: Never   Smokeless tobacco: Never  Vaping Use   Vaping Use: Never used  Substance Use Topics   Alcohol use: No   Drug use: No    Home Medications Prior to Admission medications   Medication Sig Start Date End Date Taking? Authorizing Provider  predniSONE (DELTASONE) 20 MG tablet Take 3 tablets (60 mg total) by mouth daily for 5 days. 09/12/21 09/17/21 Yes Holly Pring, DO  famotidine (PEPCID) 20 MG tablet Take 1 tablet (20 mg total) by mouth 2 (two) times daily. 01/22/18   Arby Barrette, MD  hydrOXYzine (ATARAX/VISTARIL) 25 MG tablet Take 1-2 tablets (25-50 mg total) by mouth every 6 (six) hours as needed for itching. 07/30/18   Gilda Crease, MD  ibuprofen (ADVIL,MOTRIN) 800 MG tablet Take 1 tablet (800 mg total) by mouth every 8 (eight) hours as needed for moderate pain. Take with food 05/24/16   Sherren Mocha, MD  methocarbamol (ROBAXIN) 750 MG tablet Take 1 tablet (750 mg total) by mouth every 6 (six) hours as needed for  muscle spasms. 05/24/16   Sherren Mocha, MD  ondansetron (ZOFRAN ODT) 4 MG disintegrating tablet Take 1 tablet (4 mg total) by mouth every 4 (four) hours as needed for nausea or vomiting. 01/22/18   Arby Barrette, MD  oxyCODONE-acetaminophen (PERCOCET/ROXICET) 5-325 MG tablet Take 1 tablet by mouth every 4 (four) hours as needed. Patient not taking: Reported on 05/24/2016 10/08/15   Margarita Grizzle, MD    Allergies    Other, Tramadol, and Vicodin [hydrocodone-acetaminophen]  Review of Systems   Review of Systems  Constitutional:  Negative for fever.  Musculoskeletal:  Negative for arthralgias.  Skin:  Positive for rash.   Physical Exam Updated Vital Signs BP (!) 163/78 (BP Location: Right Arm)   Pulse 80   Temp 98.1 F (36.7 C) (Oral)   Resp 20   Ht 5\' 9"  (1.753 m)   Wt 92.1 kg   SpO2 98%   BMI 29.98 kg/m   Physical Exam Pulmonary:     Effort: Pulmonary effort is normal.  Skin:    Findings: Rash present.  Neurological:     Mental Status: He is alert.    ED Results / Procedures / Treatments   Labs (all labs ordered are listed, but only abnormal results are displayed) Labs Reviewed - No data to display  EKG None  Radiology No  results found.  Procedures Procedures   Medications Ordered in ED Medications - No data to display  ED Course  I have reviewed the triage vital signs and the nursing notes.  Pertinent labs & imaging results that were available during my care of the patient were reviewed by me and considered in my medical decision making (see chart for details).    MDM Rules/Calculators/A&P                           Johnell Nadeau eczema type rash to hands, chest, face, flexor surfaces.  Normal vitals.  No fever.  History of the same.  Fairly severe eczema history.  We will put him on steroids and have him follow-up with his dermatologist.  No concern for anaphylaxis other concerning rash.  Discharged in good condition.  This chart was dictated using  voice recognition software.  Despite best efforts to proofread,  errors can occur which can change the documentation meaning.   Final Clinical Impression(s) / ED Diagnoses Final diagnoses:  Eczema, unspecified type    Rx / DC Orders ED Discharge Orders          Ordered    predniSONE (DELTASONE) 20 MG tablet  Daily        09/12/21 1308             Virgina Norfolk, DO 09/12/21 1310

## 2021-09-12 NOTE — ED Triage Notes (Signed)
Pt c/o scattered rash x 2-3 days-NAD-steady gait

## 2021-09-12 NOTE — Discharge Instructions (Signed)
Take steroids as prescribed.  Follow-up with your dermatologist.

## 2021-12-21 ENCOUNTER — Encounter (HOSPITAL_BASED_OUTPATIENT_CLINIC_OR_DEPARTMENT_OTHER): Payer: Self-pay | Admitting: Urology

## 2021-12-21 ENCOUNTER — Other Ambulatory Visit: Payer: Self-pay

## 2021-12-21 DIAGNOSIS — R21 Rash and other nonspecific skin eruption: Secondary | ICD-10-CM | POA: Diagnosis not present

## 2021-12-21 NOTE — ED Triage Notes (Signed)
Pt states exacerbation of eczema x 3 days ?States rash worse to hands and neck ?Swelling to neck rash  ? ?

## 2021-12-22 ENCOUNTER — Emergency Department (HOSPITAL_BASED_OUTPATIENT_CLINIC_OR_DEPARTMENT_OTHER)
Admission: EM | Admit: 2021-12-22 | Discharge: 2021-12-22 | Disposition: A | Discharge status: Home / Self Care | Payer: 59 | Attending: Emergency Medicine | Admitting: Emergency Medicine

## 2021-12-22 ENCOUNTER — Encounter (HOSPITAL_BASED_OUTPATIENT_CLINIC_OR_DEPARTMENT_OTHER): Payer: Self-pay | Admitting: Emergency Medicine

## 2021-12-22 MED ORDER — LORATADINE 10 MG PO TABS
10.0000 mg | ORAL_TABLET | Freq: Once | ORAL | Status: AC
  Administered 2021-12-22: 10 mg via ORAL
  Filled 2021-12-22: qty 1

## 2021-12-22 MED ORDER — LORATADINE 10 MG PO TABS: 10.0000 mg | ORAL_TABLET | Freq: Every day | ORAL | 0 refills | Status: DC

## 2021-12-22 MED ORDER — CLOBETASOL PROPIONATE 0.05 % EX CREA: 1.0000 "application " | TOPICAL_CREAM | Freq: Two times a day (BID) | CUTANEOUS | 0 refills | Status: DC

## 2021-12-22 NOTE — ED Provider Notes (Signed)
?[facility] EMERGENCY DEPARTMENT ?Provider Note ? ? ?CSN: [id number] ?Arrival date & time: 12/21/21  2340 ? ?  ? ?History ? ?Chief Complaint  ?Patient presents with  ? Rash  ? ? ?Stuart Hubbard is a 66 y.o. male. ? ?The history is provided by the patient.  ?Rash ?Location:  Head/neck, shoulder/arm and leg ?Head/neck rash location:  L neck and R neck ?Quality: dryness and itchiness   ?Quality: not painful, not red and not weeping   ?Severity:  Moderate ?Onset quality:  Gradual ?Duration: years. ?Timing:  Constant ?Progression:  Waxing and waning ?Chronicity:  Chronic ?Context: not animal contact and not plant contact   ?Relieved by:  Nothing ?Worsened by:  Nothing ?Ineffective treatments:  None tried ?Associated symptoms: no abdominal pain, no fever, no induration, no shortness of breath, no throat swelling, no tongue swelling, no URI, not vomiting and not wheezing   ?Patient with eczema who has stopped seeing dermatology and PMD presents with worsening eczema as he is off his medication.   ?  ? ?Home Medications ?Prior to Admission medications   ?Medication Sig Start Date End Date Taking? Authorizing Provider  ?clobetasol cream (TEMOVATE) 0.05 % Apply 1 application. topically 2 (two) times daily. 12/22/21  Yes Joseph Johns, MD  ?loratadine (CLARITIN) 10 MG tablet Take 1 tablet (10 mg total) by mouth daily. 12/22/21  Yes Densel Kronick, MD  ?famotidine (PEPCID) 20 MG tablet Take 1 tablet (20 mg total) by mouth 2 (two) times daily. 01/22/18   Arby Barrette, MD  ?hydrOXYzine (ATARAX/VISTARIL) 25 MG tablet Take 1-2 tablets (25-50 mg total) by mouth every 6 (six) hours as needed for itching. 07/30/18   Gilda Crease, MD  ?ibuprofen (ADVIL,MOTRIN) 800 MG tablet Take 1 tablet (800 mg total) by mouth every 8 (eight) hours as needed for moderate pain. Take with food 05/24/16   Sherren Mocha, MD  ?methocarbamol (ROBAXIN) 750 MG tablet Take 1 tablet (750 mg total) by mouth every 6 (six) hours as needed for  muscle spasms. 05/24/16   Sherren Mocha, MD  ?ondansetron (ZOFRAN ODT) 4 MG disintegrating tablet Take 1 tablet (4 mg total) by mouth every 4 (four) hours as needed for nausea or vomiting. 01/22/18   Arby Barrette, MD  ?oxyCODONE-acetaminophen (PERCOCET/ROXICET) 5-325 MG tablet Take 1 tablet by mouth every 4 (four) hours as needed. ?Patient not taking: Reported on 05/24/2016 10/08/15   Margarita Grizzle, MD  ?   ? ?Allergies    ?Other, Tramadol, and Vicodin [hydrocodone-acetaminophen]   ? ?Review of Systems   ?Review of Systems  ?Constitutional:  Negative for fever.  ?HENT:  Negative for congestion.   ?Eyes:  Negative for redness.  ?Respiratory:  Negative for shortness of breath, wheezing and stridor.   ?Cardiovascular:  Negative for chest pain.  ?Gastrointestinal:  Negative for abdominal pain and vomiting.  ?Genitourinary:  Negative for difficulty urinating.  ?Musculoskeletal:  Negative for gait problem, neck pain and neck stiffness.  ?Skin:  Positive for rash.  ?Neurological:  Negative for facial asymmetry.  ?Psychiatric/Behavioral:  Negative for agitation.   ?All other systems reviewed and are negative. ? ?Physical Exam ?Updated Vital Signs ?BP 140/78 (BP Location: Left Arm)   Pulse 74   Temp 97.7 ?F (36.5 ?C) (Oral)   Resp 18   Ht 5\' 9"  (1.753 m)   Wt 88.5 kg   SpO2 100%   BMI 28.80 kg/m?  ?Physical Exam ?Vitals and nursing note reviewed.  ?Constitutional:   ?   General:  He is not in acute distress. ?   Appearance: Normal appearance.  ?HENT:  ?   Head: Normocephalic and atraumatic.  ?   Nose: Nose normal.  ?Eyes:  ?   Extraocular Movements: Extraocular movements intact.  ?   Pupils: Pupils are equal, round, and reactive to light.  ?Cardiovascular:  ?   Rate and Rhythm: Normal rate and regular rhythm.  ?   Pulses: Normal pulses.  ?   Heart sounds: Normal heart sounds.  ?Pulmonary:  ?   Effort: Pulmonary effort is normal.  ?   Breath sounds: Normal breath sounds.  ?Abdominal:  ?   General: Bowel sounds are normal.   ?   Tenderness: There is no abdominal tenderness. There is no guarding.  ?Musculoskeletal:  ?   Cervical back: Normal range of motion and neck supple. No rigidity or tenderness.  ?Lymphadenopathy:  ?   Cervical: No cervical adenopathy.  ?Skin: ?   General: Skin is warm and dry.  ?   Capillary Refill: Capillary refill takes less than 2 seconds.  ?   Comments: Lichenification with patches on extensor surfaces and neck.  No swelling.  No warmth no tenderness no fluctuance.    ?Neurological:  ?   General: No focal deficit present.  ?   Mental Status: He is alert and oriented to person, place, and time.  ?   Deep Tendon Reflexes: Reflexes normal.  ?Psychiatric:     ?   Mood and Affect: Mood normal.     ?   Behavior: Behavior normal.  ? ? ?ED Results / Procedures / Treatments   ?Labs ?(all labs ordered are listed, but only abnormal results are displayed) ?Labs Reviewed - No data to display ? ?EKG ?None ? ?Radiology ?No results found. ? ?Procedures ?Procedures  ? ? ?Medications Ordered in ED ?Medications  ?loratadine (CLARITIN) tablet 10 mg (has no administration in time range)  ? ? ?ED Course/ Medical Decision Making/ A&P ?  ?                        ?Medical Decision Making ?Known eczema off all his medication and stopped following up with PMD and derm  ? ?Amount and/or Complexity of Data Reviewed ?External Data Reviewed: notes. ?   Details: previous dermatology notes ? ?Risk ?OTC drugs. ?Prescription drug management. ?Risk Details: I will restart clobetasol which was recommended by dermatology with dupixent.  I have advised close follow up with dermatology given extensive history of eczema.  Given poorly controlled DM I do not think steroids orally or injectable are advisable at this time.   ? ? ? ?Final Clinical Impression(s) / ED Diagnoses ?Final diagnoses:  ?None  ? ?Return for intractable cough, coughing up blood, fevers > 100.4 unrelieved by medication, shortness of breath, intractable vomiting, chest pain,  shortness of breath, weakness, numbness, changes in speech, facial asymmetry, abdominal pain, passing out, Inability to tolerate liquids or food, cough, altered mental status or any concerns. No signs of systemic illness or infection. The patient is nontoxic-appearing on exam and vital signs are within normal limits.  ?I have reviewed the triage vital signs and the nursing notes. Pertinent labs & imaging results that were available during my care of the patient were reviewed by me and considered in my medical decision making (see chart for details). After history, exam, and medical workup I feel the patient has been appropriately medically screened and is safe for discharge home. Pertinent diagnoses  were discussed with the patient. Patient was given return precautions.  ?Rx / DC Orders ?ED Discharge Orders   ? ?      Ordered  ?  clobetasol cream (TEMOVATE) 0.05 %  2 times daily       ? 12/22/21 0107  ?  loratadine (CLARITIN) 10 MG tablet  Daily       ? 12/22/21 0114  ? ?  ?  ? ?  ? ? ?  ?Alvie Speltz, MD ?12/22/21 0121 ? ?

## 2022-12-13 ENCOUNTER — Encounter: Payer: Self-pay | Admitting: Dermatology

## 2022-12-13 ENCOUNTER — Ambulatory Visit: Payer: Medicare HMO | Admitting: Dermatology

## 2022-12-13 VITALS — BP 144/85

## 2022-12-13 DIAGNOSIS — L209 Atopic dermatitis, unspecified: Secondary | ICD-10-CM

## 2022-12-13 DIAGNOSIS — L299 Pruritus, unspecified: Secondary | ICD-10-CM | POA: Diagnosis not present

## 2022-12-13 MED ORDER — HYDROCORTISONE 2.5 % EX CREA: TOPICAL_CREAM | Freq: Two times a day (BID) | CUTANEOUS | 2 refills | Status: DC | PRN

## 2022-12-13 MED ORDER — TRIAMCINOLONE ACETONIDE 0.1 % EX CREA: 1.0000 | TOPICAL_CREAM | Freq: Two times a day (BID) | CUTANEOUS | 2 refills | Status: AC | PRN

## 2022-12-13 NOTE — Patient Instructions (Signed)
Atopic Dermatitis Atopic dermatitis is a skin disorder that causes inflammation of the skin. It is marked by a red rash and itchy, dry, scaly skin. It is the most common type of eczema. Eczema is a group of skin conditions that cause the skin to become rough and swollen. This condition is generally worse during the cooler winter months and often improves during the warm summer months. Atopic dermatitis usually starts showing signs in infancy and can last through adulthood. This condition cannot be passed from one person to another (is not contagious). Atopic dermatitis may not always be present, but when it is, it is called a flare-up. What are the causes? The exact cause of this condition is not known. Flare-ups may be triggered by: Coming in contact with something that you are sensitive or allergic to (allergen). Stress. Certain foods. Extremely hot or cold weather. Harsh chemicals and soaps. Dry air. Chlorine. What increases the risk? This condition is more likely to develop in people who have a personal or family history of: Eczema. Allergies. Asthma. Hay fever. What are the signs or symptoms? Symptoms of this condition include: Dry, scaly skin. Red, itchy rash. Itchiness, which can be severe. This may occur before the skin rash. This can make sleeping difficult. Skin thickening and cracking that can occur over time. How is this diagnosed? This condition is diagnosed based on: Your symptoms. Your medical history. A physical exam. How is this treated? There is no cure for this condition, but symptoms can usually be controlled. Treatment focuses on: Controlling the itchiness and scratching. You may be given medicines, such as antihistamines or steroid creams. Limiting exposure to allergens. Recognizing situations that cause stress and developing a plan to manage stress. If your atopic dermatitis does not get better with medicines, or if it is all over your body (widespread), a  treatment using a specific type of light (phototherapy) may be used. Follow these instructions at home: Skin care  Keep your skin well moisturized. Doing this seals in moisture and helps to prevent dryness. Use unscented lotions that have petroleum in them. Avoid lotions that contain alcohol or water. They can dry the skin. Keep baths or showers short (less than 5 minutes) in warm water. Do not use hot water. Use mild, unscented cleansers for bathing. Avoid soap and bubble bath. Apply a moisturizer to your skin right after a bath or shower. Do not apply anything to your skin without checking with your health care provider. General instructions Take or apply over-the-counter and prescription medicines only as told by your health care provider. Dress in clothes made of cotton or cotton blends. Dress lightly because heat increases itchiness. When washing your clothes, rinse your clothes twice so all of the soap is removed. Avoid any triggers that can cause a flare-up. Keep your fingernails cut short. Avoid scratching. Scratching makes the rash and itchiness worse. A break in the skin from scratching could result in a skin infection (impetigo). Do not be around people who have cold sores or fever blisters. If you get the infection, it may cause your atopic dermatitis to worsen. Keep all follow-up visits. This is important. Contact a health care provider if: Your itchiness interferes with sleep. Your rash gets worse or is not better within one week of starting treatment. You have a fever. You have a rash flare-up after having contact with someone who has cold sores or fever blisters. Get help right away if: You develop pus or soft yellow scabs in the rash  area. Summary Atopic dermatitis causes a red rash and itchy, dry, scaly skin. Treatment focuses on controlling the itchiness and scratching, limiting exposure to things that you are sensitive or allergic to (allergens), recognizing  situations that cause stress, and developing a plan to manage stress. Keep your skin well moisturized. Keep baths or showers shorter than 5 minutes and use warm water. Do not use hot water. This information is not intended to replace advice given to you by your health care provider. Make sure you discuss any questions you have with your health care provider.                                                                    Daily Skincare Regimen: Patient Handout  Morning Routine:  Cleanse: Start your day by washing your face with a gentle cleanser. Choose a product suitable for your skin type, such as CeraVe, Neutrogena, Cetaphil, or LaRoche-Posay. Gently massage the cleanser onto damp skin, then rinse thoroughly with lukewarm water and pat dry with a clean towel.  Apply Medication: Apply prescription medication: HYDROCORTISONE 2.5% CREAM to face and KENALOG 0.1%Cream to body --> use for up to 2 weeks but then you must stop for 2 weeks before restarting  Moisturize: Finish your morning routine by applying a moisturizer to your face and neck. Opt for a moisturizer that has SPF included, is suitable for your skin type and provides hydration without clogging pores. Consider brands like CeraVe, Neutrogena, Cetaphil, or LaRoche-Posay for effective hydration and skin barrier support.  Evening Routine:  Cleanse: Before bed, cleanse your face again with a gentle cleanser to remove makeup, dirt, and impurities accumulated throughout the day. Use the same cleanser as in the morning and follow the same steps for cleansing.  Apply Medication: Ensure that your skin is completely dry before applying any topical treatments to maximize their efficacy.  Apply prescription medication  Apply prescription medication: HYDROCORTISONE 2.5% CREAM to face and KENALOG 0.1%Cream to body --> use for up to 2 weeks but then you must stop for 2 weeks before restarting   Moisturize: After applying any medications,  moisturize your skin to seal in hydration and support skin barrier function. Choose a moisturizer suitable for nighttime use that helps replenish moisture overnight. Look for products from trusted brands like CeraVe, Neutrogena, Cetaphil, or LaRoche-Posay for optimal results.   Additional Tips:  Sun Protection: During the daytime, it is essential to apply a broad-spectrum sunscreen with an SPF of 30 or higher to protect your skin from harmful UV rays. Apply sunscreen as the last step of your morning skincare routine and reapply throughout the day as needed, especially if you will be spending time outdoors.  Hydration: Drink plenty of water throughout the day to keep your skin hydrated from within.  Healthy Lifestyle: Maintain a balanced diet, get regular exercise, manage stress, and prioritize adequate sleep to support overall skin health.   Following a consistent daily skincare regimen can help promote healthy, radiant skin and minimize the risk of common skin issues. Be patient and consistent with your routine, and remember to listen to your skin's needs  Document Revised: 07/05/2020 Document Reviewed: 07/05/2020 Elsevier Patient Education  [place].

## 2022-12-13 NOTE — Progress Notes (Signed)
New Patient Visit  Subjective  Stuart Hubbard is a 67 y.o. male who presents for the following: Eczema (Dx in 2020. Patch test says reactions are from fragrance and chemicals. Severe breakouts to the point of having to go to urgent care or ED. Flare up on hands currently. Urgent care gave steroid shot in may, cleared up really well. Went to ER on 12/12, was given a oral steroid, did not help. Using kenalog gel, did not work. Had cream from someone else, the cream worked really well. Took dupixent two years ago, had bad hives from it. Last derm appointment about a year ago. ).    Objective  Well appearing patient in no apparent distress; mood and affect are within normal limits.  A focused examination was performed including face, neck, scalp, and hands. Relevant physical exam findings are noted in the Assessment and Plan.  Pt exhibits pruritus (lichenified skin and mild excoriations)  Head - Anterior (Face), Left Hand - Posterior, Neck - Anterior, Neck - Posterior, Right Hand - Posterior Hyperpigmented lichenified plaques on neck and hands    Assessment & Plan  Atopic dermatitis, unspecified type Head - Anterior (Face); Left Hand - Posterior; Right Hand - Posterior; Neck - Anterior; Neck - Posterior  Continue using Dove and Cetaphil daily then moisturize  Apply Kenalog 0.1% BID to body for two weeks on then two weeks off until symptoms clear  Apply Hydrocortisone BID to the face for two weeks on then two weeks off until symptoms clear  Follow up in three months   Plan: Counseling I counseled the patient regarding the following: Skin care: Patient should bathe using lukewarm water with a mild cleanser and moisturize immediately after. Emollients should be applied at least 2-3 times daily. Avoid scented detergents or fabric softeners. Keep fingernails short. Avoid excessive hand washing. Expectations: The patient is aware that eczema is chronic in nature and can improve with  moisturizers and topical steroids and worsen with stress, scented soaps, detergents, scratching, dry skin, changes in weather and skin infections. Contact office if: Eczema worsens or fails to improve despite several weeks of treatment; patient develops skin infections (such as: yellow honey colored crusts or cold sores).  I recommended the following: Moisturizers   The following medication counseling was provided: I discussed with the patient that prolonged use of topical steroids can result in the increased appearance of superficial blood vessels (telangiectasias), lightening (hypopigmentation) and thinning of the skin (atrophy). Patient understands to avoid using high potency steroids in skin folds, the groin or the face. The patient verbalized understanding of the proper use and possible adverse effects of topical steroids. All of the patient's questions and concerns were addressed.   triamcinolone cream (KENALOG) 0.1 % - Left Hand - Posterior, Neck - Anterior, Neck - Posterior, Right Hand - Posterior Apply 1 Application topically 2 (two) times daily as needed. Apply to eczema areas on the body BID for two weeks on then two weeks off until symptoms clear  hydrocortisone 2.5 % cream - Head - Anterior (Face) Apply topically 2 (two) times daily as needed (Rash). Apply to eczema areas on the face BID for two weeks on then two weeks off until symptoms clear  Pruritus  Counseling provided on the management of itchiness includes incorporating a consistent skincare regimen involving gentle cleansers and moisturizers to hydrate the skin and reduce irritation. Emphasized the importance of avoiding harsh soaps, fragrances, and environmental triggers that may exacerbate itching. Discussed the use of topical corticosteroids  or calcineurin inhibitors to alleviate inflammation and itch.   Advised on lifestyle modifications such as maintaining a comfortable room temperature, wearing loose-fitting clothing,  and practicing stress-reducing techniques to minimize pruritus episodes.   Encouraged regular follow-up appointments to monitor progress and adjust treatment as needed.     Return in about 3 months (around 03/15/2023) for Eczema follow up.  I, Zigmund Gottron, CMA, am acting as scribe for Ellard Artis, MD.   Documentation: I have reviewed the above documentation for accuracy and completeness, and I agree with the above  [name], DO

## 2023-01-08 ENCOUNTER — Ambulatory Visit: Payer: Medicare HMO | Admitting: Family Medicine

## 2023-01-09 NOTE — Progress Notes (Unsigned)
   New Patient Visit  There were no vitals taken for this visit.   Subjective:    Patient ID: Navarre Akkerman, male    DOB: 09-Jun-1956, 67 y.o.   MRN: [id number]  CC: No chief complaint on file.   HPI: Deontay Fessel is a 67 y.o. male presents for new patient visit to establish care.  Introduced to Designer, jewellery role and practice setting.  All questions answered.  Discussed provider/patient relationship and expectations.   Past Medical History:  Diagnosis Date   Chronic pain syndrome    DDD (degenerative disc disease), lumbar    Eczema    Kidney stones     Past Surgical History:  Procedure Laterality Date   LITHOTRIPSY      No family history on file.   Social History   Tobacco Use   Smoking status: Never   Smokeless tobacco: Never  Vaping Use   Vaping Use: Never used  Substance Use Topics   Alcohol use: No   Drug use: No    Current Outpatient Medications on File Prior to Visit  Medication Sig Dispense Refill   clobetasol cream (TEMOVATE) [id number] % Apply 1 application. topically 2 (two) times daily. 30 g 0   famotidine (PEPCID) 20 MG tablet Take 1 tablet (20 mg total) by mouth 2 (two) times daily. 10 tablet 0   hydrocortisone 2.5 % cream Apply topically 2 (two) times daily as needed (Rash). Apply to eczema areas on the face BID for two weeks on then two weeks off until symptoms clear 30 g 2   hydrOXYzine (ATARAX/VISTARIL) 25 MG tablet Take 1-2 tablets (25-50 mg total) by mouth every 6 (six) hours as needed for itching. 30 tablet 0   ibuprofen (ADVIL,MOTRIN) 800 MG tablet Take 1 tablet (800 mg total) by mouth every 8 (eight) hours as needed for moderate pain. Take with food 90 tablet 0   loratadine (CLARITIN) 10 MG tablet Take 1 tablet (10 mg total) by mouth daily. 30 tablet 0   methocarbamol (ROBAXIN) 750 MG tablet Take 1 tablet (750 mg total) by mouth every 6 (six) hours as needed for muscle spasms. 120 tablet 0   ondansetron (ZOFRAN ODT) 4 MG disintegrating tablet  Take 1 tablet (4 mg total) by mouth every 4 (four) hours as needed for nausea or vomiting. 20 tablet 0   oxyCODONE-acetaminophen (PERCOCET/ROXICET) 5-325 MG tablet Take 1 tablet by mouth every 4 (four) hours as needed. (Patient not taking: Reported on 05/24/2016) 6 tablet 0   triamcinolone cream (KENALOG) 0.1 % Apply 1 Application topically 2 (two) times daily as needed. Apply to eczema areas on the body BID for two weeks on then two weeks off until symptoms clear 240 g 2   No current facility-administered medications on file prior to visit.     Review of Systems      Objective:    There were no vitals taken for this visit.  Wt Readings from Last 3 Encounters:  12/21/21 195 lb (88.5 kg)  09/12/21 203 lb (92.1 kg)  12/05/18 200 lb (90.7 kg)    BP Readings from Last 3 Encounters:  12/13/22 (!) 144/85  12/22/21 (!) 146/74  09/12/21 (!) 163/78    Physical Exam     Assessment & Plan:   Problem List Items Addressed This Visit   None    Follow up plan: No follow-ups on file.

## 2023-01-10 ENCOUNTER — Ambulatory Visit (INDEPENDENT_AMBULATORY_CARE_PROVIDER_SITE_OTHER): Payer: Medicare HMO | Admitting: Nurse Practitioner

## 2023-01-10 ENCOUNTER — Encounter: Payer: Self-pay | Admitting: Nurse Practitioner

## 2023-01-10 VITALS — BP 138/84 | HR 76 | Temp 98.7°F | Ht 69.0 in | Wt 197.0 lb

## 2023-01-10 DIAGNOSIS — M25511 Pain in right shoulder: Secondary | ICD-10-CM

## 2023-01-10 DIAGNOSIS — R7309 Other abnormal glucose: Secondary | ICD-10-CM

## 2023-01-10 DIAGNOSIS — G8929 Other chronic pain: Secondary | ICD-10-CM

## 2023-01-10 DIAGNOSIS — E782 Mixed hyperlipidemia: Secondary | ICD-10-CM | POA: Diagnosis not present

## 2023-01-10 DIAGNOSIS — L309 Dermatitis, unspecified: Secondary | ICD-10-CM

## 2023-01-10 NOTE — Assessment & Plan Note (Signed)
Chronic, ongoing.  He is still having some pain in his right anterior shoulder where he received a steroid injection in November 2023, 4 months ago.  Will check an x-ray of his shoulder.  Recommend that he use ice/heat several times per day.  He can continue to take Tylenol or ibuprofen as needed for pain.  Stretches given to do daily.  Follow-up in 4 weeks.

## 2023-01-10 NOTE — Assessment & Plan Note (Signed)
Chronic, stable.  He is currently following with dermatology and using triamcinolone cream daily for 2 weeks and then off for 2 weeks.  He states that his eczema is doing slightly better right now.  He has tried Dupixent in the past, however he had side effects to this and had to stop it.

## 2023-01-10 NOTE — Assessment & Plan Note (Signed)
He had an elevated A1c at 6.9% several years ago.  He has not had this checked recently.  Will update labs next visit.

## 2023-01-10 NOTE — Assessment & Plan Note (Signed)
He denies chest pain and shortness of breath.  Will check labs next visit

## 2023-01-10 NOTE — Patient Instructions (Signed)
It was great to see you!  I am ordering an x-ray of shoulder. Start the attached stretches daily. You can alternate heat and ice to your shoulder.   I have attached information on a low salt diet for your blood pressure.   Let's follow-up in 4 weeks, sooner if you have concerns.  If a referral was placed today, you will be contacted for an appointment. Please note that routine referrals can sometimes take up to 3-4 weeks to process. Please call our office if you haven't heard anything after this time frame.  Take care,  Vance Peper, NP

## 2023-01-23 ENCOUNTER — Ambulatory Visit (INDEPENDENT_AMBULATORY_CARE_PROVIDER_SITE_OTHER)
Admission: RE | Admit: 2023-01-23 | Discharge: 2023-01-23 | Disposition: A | Discharge status: Home / Self Care | Payer: Medicare HMO | Source: Ambulatory Visit | Attending: Nurse Practitioner | Admitting: Nurse Practitioner

## 2023-01-23 ENCOUNTER — Ambulatory Visit: Payer: Medicare HMO

## 2023-01-23 DIAGNOSIS — G8929 Other chronic pain: Secondary | ICD-10-CM

## 2023-01-23 DIAGNOSIS — M25511 Pain in right shoulder: Secondary | ICD-10-CM | POA: Diagnosis not present

## 2023-01-31 ENCOUNTER — Ambulatory Visit (INDEPENDENT_AMBULATORY_CARE_PROVIDER_SITE_OTHER): Payer: Medicare HMO | Admitting: Nurse Practitioner

## 2023-01-31 ENCOUNTER — Encounter: Payer: Self-pay | Admitting: Nurse Practitioner

## 2023-01-31 VITALS — BP 128/80 | HR 71 | Temp 97.9°F | Ht 69.0 in | Wt 195.6 lb

## 2023-01-31 DIAGNOSIS — E782 Mixed hyperlipidemia: Secondary | ICD-10-CM

## 2023-01-31 DIAGNOSIS — R7309 Other abnormal glucose: Secondary | ICD-10-CM

## 2023-01-31 DIAGNOSIS — G8929 Other chronic pain: Secondary | ICD-10-CM

## 2023-01-31 DIAGNOSIS — Z0000 Encounter for general adult medical examination without abnormal findings: Secondary | ICD-10-CM

## 2023-01-31 DIAGNOSIS — M25511 Pain in right shoulder: Secondary | ICD-10-CM | POA: Diagnosis not present

## 2023-01-31 DIAGNOSIS — Z1211 Encounter for screening for malignant neoplasm of colon: Secondary | ICD-10-CM

## 2023-01-31 DIAGNOSIS — Z125 Encounter for screening for malignant neoplasm of prostate: Secondary | ICD-10-CM | POA: Diagnosis not present

## 2023-01-31 LAB — CBC
HCT: 46.3 % (ref 39.0–52.0)
Hemoglobin: 15.2 g/dL (ref 13.0–17.0)
MCHC: 32.9 g/dL (ref 30.0–36.0)
MCV: 91.5 fl (ref 78.0–100.0)
Platelets: 267 10*3/uL (ref 150.0–400.0)
RBC: 5.05 Mil/uL (ref 4.22–5.81)
RDW: 14.4 % (ref 11.5–15.5)
WBC: 4.9 10*3/uL (ref 4.0–10.5)

## 2023-01-31 LAB — LIPID PANEL
Cholesterol: 251 mg/dL — ABNORMAL HIGH (ref 0–200)
HDL: 40.3 mg/dL (ref >39.00)
Total CHOL/HDL Ratio: 6
Triglycerides: 412 mg/dL — ABNORMAL HIGH (ref 0.0–149.0)

## 2023-01-31 LAB — COMPREHENSIVE METABOLIC PANEL
ALT: 30 U/L (ref 0–53)
AST: 20 U/L (ref 0–37)
Albumin: 4.2 g/dL (ref 3.5–5.2)
Alkaline Phosphatase: 89 U/L (ref 39–117)
BUN: 15 mg/dL (ref 6–23)
CO2: 28 mEq/L (ref 19–32)
Calcium: 9.7 mg/dL (ref 8.4–10.5)
Chloride: 99 mEq/L (ref 96–112)
Creatinine, Ser: 1 mg/dL (ref 0.40–1.50)
GFR: 79.14 mL/min (ref >60.00)
Glucose, Bld: 232 mg/dL — ABNORMAL HIGH (ref 70–99)
Potassium: 4.3 mEq/L (ref 3.5–5.1)
Sodium: 137 mEq/L (ref 135–145)
Total Bilirubin: 0.3 mg/dL (ref 0.2–1.2)
Total Protein: 6.7 g/dL (ref 6.0–8.3)

## 2023-01-31 LAB — HEMOGLOBIN A1C: Hgb A1c MFr Bld: 8 % — ABNORMAL HIGH (ref 4.6–6.5)

## 2023-01-31 LAB — PSA: PSA: 0.81 ng/mL (ref 0.10–4.00)

## 2023-01-31 LAB — LDL CHOLESTEROL, DIRECT: Direct LDL: 138 mg/dL

## 2023-01-31 MED ORDER — MELOXICAM 15 MG PO TABS: 15.0000 mg | ORAL_TABLET | Freq: Every day | ORAL | 0 refills | Status: DC

## 2023-01-31 NOTE — Progress Notes (Unsigned)
BP 128/80 (BP Location: Left Arm)   Pulse 71   Temp 97.9 F (36.6 C)   Ht 5\' 9"  (1.753 m)   Wt 195 lb 9.6 oz (88.7 kg)   SpO2 97%   BMI 28.89 kg/m    Subjective:    Patient ID: Stuart Hubbard, male    DOB: 24-May-1956, 67 y.o.   MRN: [id number]  HPI: Stuart Hubbard is a 67 y.o. male presenting on 01/31/2023 for comprehensive medical examination. Current medical complaints include: right shoulder pain  He currently lives with: daughter  Functional Status Survey: Is the patient deaf or have difficulty hearing?: No Does the patient have difficulty seeing, even when wearing glasses/contacts?: No Does the patient have difficulty concentrating, remembering, or making decisions?: No Does the patient have difficulty walking or climbing stairs?: No Does the patient have difficulty dressing or bathing?: No Does the patient have difficulty doing errands alone such as visiting a doctor's office or shopping?: No  FALL RISK:    01/31/2023   10:12 AM 01/10/2023    1:32 PM 05/24/2016    4:57 PM  Fall Risk   Falls in the past year? 0 0 No  Number falls in past yr: 0 0   Injury with Fall? 1 0   Risk for fall due to : No Fall Risks No Fall Risks   Follow up Falls evaluation completed Falls evaluation completed     Depression Screen    01/31/2023   10:12 AM 01/10/2023    2:15 PM 05/24/2016    4:57 PM  Depression screen PHQ 2/9  Decreased Interest 0 0 0  Down, Depressed, Hopeless 0 0 0  PHQ - 2 Score 0 0 0  Altered sleeping 0 0   Tired, decreased energy 0 0   Change in appetite 0 0   Feeling bad or failure about yourself  0 0   Trouble concentrating 0 0   Moving slowly or fidgety/restless 0 0   Suicidal thoughts 0 0   PHQ-9 Score 0 0   Difficult doing work/chores Not difficult at all Not difficult at all     Advanced Directives <no information>  Past Medical History:  Past Medical History:  Diagnosis Date   Chronic pain syndrome    DDD (degenerative disc disease), lumbar     Eczema    Hyperlipidemia    Kidney stones     Surgical History:  Past Surgical History:  Procedure Laterality Date   CIRCUMCISION     LITHOTRIPSY      Medications:  Current Outpatient Medications on File Prior to Visit  Medication Sig   clobetasol cream (TEMOVATE) 0.05 % Apply 1 application. topically 2 (two) times daily.   hydrocortisone 2.5 % cream Apply topically 2 (two) times daily as needed (Rash). Apply to eczema areas on the face BID for two weeks on then two weeks off until symptoms clear   ibuprofen (ADVIL,MOTRIN) 800 MG tablet Take 1 tablet (800 mg total) by mouth every 8 (eight) hours as needed for moderate pain. Take with food   mometasone (ELOCON) 0.1 % cream Apply twice a day on affected area for 2 weeks , then once a day for 2 more weeks and stop it   triamcinolone cream (KENALOG) 0.1 % Apply 1 Application topically 2 (two) times daily as needed. Apply to eczema areas on the body BID for two weeks on then two weeks off until symptoms clear   No current facility-administered medications on file prior to visit.  Allergies:  Allergies  Allergen Reactions   Other     Hair dye with PPD   Tramadol    Vicodin [Hydrocodone-Acetaminophen] Hives    Social History:  Social History   Socioeconomic History   Marital status: Widowed    Spouse name: Not on file   Number of children: 1   Years of education: Not on file   Highest education level: Not on file  Occupational History   Not on file  Tobacco Use   Smoking status: Never   Smokeless tobacco: Never  Vaping Use   Vaping Use: Never used  Substance and Sexual Activity   Alcohol use: No   Drug use: No   Sexual activity: Not Currently  Other Topics Concern   Not on file  Social History Narrative   Not on file   Social Determinants of Health   Financial Resource Strain: Low Risk  (01/31/2023)   Overall Financial Resource Strain (CARDIA)    Difficulty of Paying Living Expenses: Not hard at all  Food  Insecurity: No Food Insecurity (01/31/2023)   Hunger Vital Sign    Worried About Running Out of Food in the Last Year: Never true    Ran Out of Food in the Last Year: Never true  Transportation Needs: No Transportation Needs (01/31/2023)   PRAPARE - Administrator, Civil Service (Medical): No    Lack of Transportation (Non-Medical): No  Physical Activity: Insufficiently Active (01/31/2023)   Exercise Vital Sign    Days of Exercise per Week: 3 days    Minutes of Exercise per Session: 20 min  Stress: No Stress Concern Present (01/31/2023)   [employer] of Occupational Health - Occupational Stress Questionnaire    Feeling of Stress : Not at all  Social Connections: Moderately Integrated (01/31/2023)   Social Connection and Isolation Panel [NHANES]    Frequency of Communication with Friends and Family: More than three times a week    Frequency of Social Gatherings with Friends and Family: More than three times a week    Attends Religious Services: More than 4 times per year    Active Member of [employer] or Organizations: Yes    Attends Banker Meetings: More than 4 times per year    Marital Status: Never married  Intimate Partner Violence: Not At Risk (01/31/2023)   Humiliation, Afraid, Rape, and Kick questionnaire    Fear of Current or Ex-Partner: No    Emotionally Abused: No    Physically Abused: No    Sexually Abused: No   Social History   Tobacco Use  Smoking Status Never  Smokeless Tobacco Never   Social History   Substance and Sexual Activity  Alcohol Use No    Family History:  No family history on file.  Past medical history, surgical history, medications, allergies, family history and social history reviewed with patient today and changes made to appropriate areas of the chart.   ROS All other ROS negative except what is listed above and in the HPI.      Objective:    BP 128/80 (BP Location: Left Arm)   Pulse 71   Temp 97.9 F (36.6 C)    Ht 5\' 9"  (1.753 m)   Wt 195 lb 9.6 oz (88.7 kg)   SpO2 97%   BMI 28.89 kg/m   Wt Readings from Last 3 Encounters:  01/31/23 195 lb 9.6 oz (88.7 kg)  01/10/23 197 lb (89.4 kg)  12/21/21 195 lb (88.5 kg)  Hearing Screening           Right ear 35 45  Left ear 02 55 25   Vision Screening   Right eye Left eye Both eyes  Without correction  With correction       Physical Exam      No data to display          Cognitive Testing - 6-CIT  Correct? Score   What year is it? yes 0 Yes = 0    No = 4  What month is it? yes 0 Yes = 0    No = 3  Remember:     Floyde Parkins, [address], [place]     What time is it? yes 0 Yes = 0    No = 3  Count backwards from 20 to 1 yes 0 Correct = 0    1 error = 2   More than 1 error = 4  Say the months of the year in reverse. yes 0 Correct = 0    1 error = 2   More than 1 error = 4  What address did I ask you to remember? yes 0 Correct = 0  1 error = 2    2 error = 4    3 error = 6    4 error = 8    All wrong = 10       TOTAL SCORE  0/28   Interpretation:  Normal  Normal (0-7) Abnormal (8-28)    Results for orders placed or performed during the hospital encounter of 08/09/18  CBC with Differential  Result Value Ref Range   WBC 16.4 (H) 4.0 - 10.5 K/uL   RBC 4.68 4.22 - 5.81 MIL/uL   Hemoglobin 13.8 13.0 - 17.0 g/dL   HCT 40.9 81.1 - 91.4 %   MCV 90.0 80.0 - 100.0 fL   MCH 29.5 26.0 - 34.0 pg   MCHC 32.8 30.0 - 36.0 g/dL   RDW 78.2 95.6 - 21.3 %   Platelets 293 150 - 400 K/uL   nRBC 0.0 0.0 - 0.2 %   Neutrophils Relative % 74 %   Neutro Abs 12.1 (H) 1.7 - 7.7 K/uL   Lymphocytes Relative 17 %   Lymphs Abs 2.8 0.7 - 4.0 K/uL   Monocytes Relative 8 %   Monocytes Absolute 1.3 (H) 0.1 - 1.0 K/uL   Eosinophils Relative 0 %   Eosinophils Absolute 0.1 0.0 - 0.5 K/uL   Basophils Relative 0 %   Basophils Absolute 0.0 0.0 - 0.1 K/uL   Immature  Granulocytes 1 %   Abs Immature Granulocytes 0.10 (H) 0.00 - 0.07 K/uL  Basic metabolic panel  Result Value Ref Range   Sodium 131 (L) 135 - 145 mmol/L   Potassium 3.9 3.5 - 5.1 mmol/L   Chloride 97 (L) 98 - 111 mmol/L   CO2 24 22 - 32 mmol/L   Glucose, Bld 161 (H) 70 - 99 mg/dL   BUN 24 (H) 6 - 20 mg/dL   Creatinine, Ser 0.86 0.61 - 1.24 mg/dL   Calcium 9.2 8.9 - 57.8 mg/dL   GFR calc non Af Amer >60 >60 mL/min   GFR calc Af Amer >60 >60 mL/min   Anion gap 10 5 - 15      Assessment & Plan:   Problem List Items Addressed This Visit  Other   Chronic right shoulder pain   Relevant Medications   meloxicam (MOBIC) 15 MG tablet   Elevated hemoglobin A1c   Relevant Orders   Comprehensive metabolic panel   Hemoglobin A1c   Mixed hyperlipidemia   Relevant Orders   CBC   Comprehensive metabolic panel   Lipid panel   Other Visit Diagnoses     Welcome to Medicare preventive visit    -  Primary   Relevant Orders   EKG 12-Lead (Completed)   Screening PSA (prostate specific antigen)       Relevant Orders   PSA   Screen for colon cancer       Relevant Orders   Cologuard        Preventative Services:  Health Risk Assessment and Personalized Prevention Plan:  Bone Mass Measurements: N/A CVD Screening: 01/31/23 Colon Cancer Screening: ordered today Depression Screening: 01/31/23 Diabetes Screening: 01/31/23 Hepatitis C screening: 09/28/20 HIV Screening: 05/24/16 Flu Vaccine: 05/10/23 Lung cancer Screening: N/A Obesity Screening: yearly Pneumonia Vaccine: Needs to get at pharmacy PSA screening: 01/31/23 Shingrix: Needs to get at pharmacy  Discussed aspirin prophylaxis for myocardial infarction prevention and decision was it was not indicated  LABORATORY TESTING:  Health maintenance labs ordered today as discussed above.   The natural history of prostate cancer and ongoing controversy regarding screening and potential treatment outcomes of prostate cancer has been  discussed with the patient. The meaning of a false positive PSA and a false negative PSA has been discussed. He indicates understanding of the limitations of this screening test and wishes to proceed with screening PSA testing.   IMMUNIZATIONS:   - Tdap: Tetanus vaccination status reviewed: last tetanus booster within 10 years. - Influenza: Postponed to flu season - Pneumovax: Not applicable - Prevnar:  Needs to get at pharmacy - Zostavax vaccine:  Needs to get at pharmacy  SCREENING: - Colonoscopy: Ordered today  Discussed with patient purpose of the colonoscopy is to detect colon cancer at curable precancerous or early stages   - AAA Screening: Not applicable  -Hearing Test:  Done today   -Spirometry: Not applicable   PATIENT COUNSELING:    Sexuality: Discussed sexually transmitted diseases, partner selection, use of condoms, avoidance of unintended pregnancy  and contraceptive alternatives.   Advised to avoid cigarette smoking.  I discussed with the patient that most people either abstain from alcohol or drink within safe limits (<=14/week and <=4 drinks/occasion for males, <=7/weeks and <= 3 drinks/occasion for females) and that the risk for alcohol disorders and other health effects rises proportionally with the number of drinks per week and how often a drinker exceeds daily limits.  Discussed cessation/primary prevention of drug use and availability of treatment for abuse.   Diet: Encouraged to adjust caloric intake to maintain  or achieve ideal body weight, to reduce intake of dietary saturated fat and total fat, to limit sodium intake by avoiding high sodium foods and not adding table salt, and to maintain adequate dietary potassium and calcium preferably from fresh fruits, vegetables, and low-fat dairy products.    stressed the importance of regular exercise  Injury prevention: Discussed safety belts, safety helmets, smoke detector, smoking near bedding or upholstery.    Dental health: Discussed importance of regular tooth brushing, flossing, and dental visits.   Follow up plan: NEXT PREVENTATIVE PHYSICAL DUE IN 1 YEAR. No follow-ups on file.

## 2023-01-31 NOTE — Patient Instructions (Signed)
It was great to see you!  We are checking your labs today and will let you know the results via mychart/phone.   I have placed a referral to orthopedics. Start meloxicam 1 tablet daily with food for your shoulder and arm  Preventative Services:  Health Risk Assessment and Personalized Prevention Plan:  Bone Mass Measurements: N/A CVD Screening: 01/31/23 Colon Cancer Screening: ordered today Depression Screening: 01/31/23 Diabetes Screening: 01/31/23 Hepatitis C screening: 09/28/20 HIV Screening: 05/24/16 Flu Vaccine: 05/10/23 Lung cancer Screening: N/A Obesity Screening: yearly Pneumonia Vaccine: Needs to get at pharmacy PSA screening: 01/31/23 Shingrix: Needs to get at pharmacy  Let's follow-up in 3 months, sooner if you have concerns.  If a referral was placed today, you will be contacted for an appointment. Please note that routine referrals can sometimes take up to 3-4 weeks to process. Please call our office if you haven't heard anything after this time frame.  Take care,  Rodman Pickle, NP

## 2023-02-01 DIAGNOSIS — Z0000 Encounter for general adult medical examination without abnormal findings: Secondary | ICD-10-CM | POA: Insufficient documentation

## 2023-02-01 MED ORDER — METFORMIN HCL ER 500 MG PO TB24: 500.0000 mg | ORAL_TABLET | Freq: Every day | ORAL | 2 refills | Status: AC

## 2023-02-01 NOTE — Assessment & Plan Note (Signed)
Check lipid panel today and treat based on results.  

## 2023-02-01 NOTE — Assessment & Plan Note (Signed)
He had an elevated A1c at 6.9% several years ago. Will update A1c today and treat based on results.

## 2023-02-01 NOTE — Assessment & Plan Note (Addendum)
Chronic, ongoing.  He is still having some pain in his right anterior shoulder where he received a steroid injection in November 2023, 5 months ago.  X-ray showed arthritis in his right shoulder. With ongoing pain, will place referral to orthopedics. Start meloxicam  daily as needed for pain. Can take tylenol as needed, but do not take ibuprofen with meloxicam.

## 2023-02-01 NOTE — Progress Notes (Signed)
EKG interpreted by me on 02/01/23 showed normal sinus rhythm with a heart rate of 75. No ST or T wave changes.

## 2023-02-01 NOTE — Assessment & Plan Note (Signed)
Health maintenance reviewed and updated. Discussed exercise and nutrition. Discussed advanced directives for 5 minutes with the patient. He has and is going to discuss this again with his daughter. EKG done today and shows normal sinus rhythm with a heart rate of 75, no ST or T wave changes. Information given to patient on health recommendations. Encouraged him to reach out with any questions.

## 2023-02-23 LAB — COLOGUARD: COLOGUARD: NEGATIVE

## 2023-02-26 ENCOUNTER — Telehealth: Payer: Self-pay

## 2023-02-26 DIAGNOSIS — G8929 Other chronic pain: Secondary | ICD-10-CM

## 2023-02-26 NOTE — Telephone Encounter (Signed)
Patient wants to proceed to have an MRI done. He said that he is having pain and has numbness in the mornings in his right arm and has had the xray done and now wants the MRI to be done. Stuart Hubbard is tired of the pain since having an injection in that arm he said that he has had nothing but pain in that arm.

## 2023-02-26 NOTE — Telephone Encounter (Signed)
Patient notified that MRI ordered and that someone from scheduling will be in contact to schedule.

## 2023-03-04 ENCOUNTER — Other Ambulatory Visit: Payer: Self-pay | Admitting: Nurse Practitioner

## 2023-03-06 NOTE — Telephone Encounter (Signed)
Refill request for Meloxicam 15 mg LR  01/31/23, #30, 0 rf LOV 01/31/23 FOV  05/02/23  Please review and advise. Thanks. Dm/cma

## 2023-03-11 ENCOUNTER — Ambulatory Visit
Admission: RE | Admit: 2023-03-11 | Discharge: 2023-03-11 | Disposition: A | Discharge status: Home / Self Care | Payer: Medicare HMO | Source: Ambulatory Visit | Attending: Nurse Practitioner | Admitting: Nurse Practitioner

## 2023-03-11 DIAGNOSIS — G8929 Other chronic pain: Secondary | ICD-10-CM

## 2023-03-15 ENCOUNTER — Ambulatory Visit: Payer: Medicare HMO | Admitting: Dermatology

## 2023-03-16 ENCOUNTER — Telehealth: Payer: Self-pay | Admitting: Nurse Practitioner

## 2023-03-16 DIAGNOSIS — G8929 Other chronic pain: Secondary | ICD-10-CM

## 2023-03-16 NOTE — Telephone Encounter (Signed)
Pt is requesting a call back to hear the results from his MRI. He stated that his condition is getting worse.

## 2023-03-16 NOTE — Telephone Encounter (Signed)
Patient notified of message below.

## 2023-03-23 ENCOUNTER — Other Ambulatory Visit: Payer: Self-pay

## 2023-03-23 ENCOUNTER — Ambulatory Visit: Payer: Medicare HMO | Admitting: Orthopaedic Surgery

## 2023-03-23 ENCOUNTER — Ambulatory Visit (INDEPENDENT_AMBULATORY_CARE_PROVIDER_SITE_OTHER): Payer: Medicare HMO | Admitting: Sports Medicine

## 2023-03-23 DIAGNOSIS — M19011 Primary osteoarthritis, right shoulder: Secondary | ICD-10-CM

## 2023-03-23 DIAGNOSIS — G8929 Other chronic pain: Secondary | ICD-10-CM | POA: Diagnosis not present

## 2023-03-23 DIAGNOSIS — M25511 Pain in right shoulder: Secondary | ICD-10-CM

## 2023-03-23 DIAGNOSIS — M7541 Impingement syndrome of right shoulder: Secondary | ICD-10-CM

## 2023-03-23 MED ORDER — BUPIVACAINE HCL 0.25 % IJ SOLN
2.0000 mL | INTRAMUSCULAR | Status: AC | PRN
Start: 2023-03-23 — End: 2023-03-23
  Administered 2023-03-23: 2 mL via INTRA_ARTICULAR

## 2023-03-23 MED ORDER — METHYLPREDNISOLONE ACETATE 40 MG/ML IJ SUSP
80.0000 mg | INTRAMUSCULAR | Status: AC | PRN
Start: 2023-03-23 — End: 2023-03-23
  Administered 2023-03-23: 80 mg via INTRA_ARTICULAR

## 2023-03-23 MED ORDER — LIDOCAINE HCL 1 % IJ SOLN
2.0000 mL | INTRAMUSCULAR | Status: AC | PRN
Start: 2023-03-23 — End: 2023-03-23
  Administered 2023-03-23: 2 mL

## 2023-03-23 NOTE — Progress Notes (Signed)
   Procedure Note  Patient: Stuart Hubbard             Date of Birth: [birth year]           MRN: [id number]             Visit Date: 03/23/2023  Procedures: Visit Diagnoses:  1. Chronic right shoulder pain    Large Joint Inj: R glenohumeral on 03/23/2023 9:43 AM Indications: pain Details: 22 G 3.5 in needle, ultrasound-guided posterior approach Medications: 2 mL lidocaine 1 %; 2 mL bupivacaine 0.25 %; 80 mg methylPREDNISolone acetate 40 MG/ML Outcome: tolerated well, no immediate complications  US-guided glenohumeral joint injection, right shoulder After discussion on risks/benefits/indications, informed verbal consent was obtained. A timeout was then performed. The patient was positioned lying lateral recumbent on examination table. The patient's shoulder was prepped with betadine and multiple alcohol swabs and utilizing ultrasound guidance, the patient's glenohumeral joint was identified on ultrasound. Using ultrasound guidance a 22-gauge, 3.5 inch needle with a mixture of 2:2:2 cc's lidocaine:bupivicaine:depomedrol was directed from a lateral to medial direction via in-plane technique into the glenohumeral joint with visualization of appropriate spread of injectate into the joint. Patient tolerated the procedure well without immediate complications.      Procedure, treatment alternatives, risks and benefits explained, specific risks discussed. Consent was given by the patient. Immediately prior to procedure a time out was called to verify the correct patient, procedure, equipment, support staff and site/side marked as required. Patient was prepped and draped in the usual sterile fashion.    - follow-up with Dr. Roda Shutters as indicated; I am happy to see them as needed  Madelyn Brunner, DO Primary Care Sports Medicine Physician  [facility]  This note was dictated using Dragon naturally speaking software and may contain errors in syntax, spelling, or content which have not  been identified prior to signing this note.

## 2023-03-23 NOTE — Progress Notes (Signed)
Office Visit Note   Patient: Stuart Hubbard           Date of Birth: [birth year]           MRN: [id number] Visit Date: 03/23/2023              Requested by: Gerre Scull, NP [address] [place],  [place] [address] PCP: Gerre Scull, NP   Assessment & Plan: Visit Diagnoses:  1. Impingement syndrome of right shoulder   2. Arthritis of right acromioclavicular joint     Plan: Impression 67 year old gentleman with high-grade degenerative changes of the supraspinatus and infraspinatus, posterior superior labral tear and severe AC joint arthritis and unfavorable acromial anatomy.  These findings were reviewed with the patient in detail and treatment options were explained.  His shoulder function is well-preserved but just limited by pain so I would like to try an intra-articular steroid injection and outpatient physical therapy.  I would like to recheck him in 6 weeks.  Follow-Up Instructions: Return in about 6 weeks (around 05/04/2023).   Orders:  Orders Placed This Encounter  Procedures   Ambulatory referral to Physical Therapy   No orders of the defined types were placed in this encounter.     Procedures: No procedures performed   Clinical Data: No additional findings.   Subjective: Chief Complaint  Patient presents with   Right Shoulder - Pain    HPI Stuart Hubbard is a very pleasant 67 year old gentleman who comes in for evaluation of chronic right shoulder pain for about 6 months.  States that it started after he got a steroid shot his right shoulder for eczema flareup.  Unfortunately the pain persisted and eventually had an MRI done.  He is right-hand dominant.  He has chronic pain in his arm with associated weakness.  Denies any radicular pain. Review of Systems  Constitutional: Negative.   HENT: Negative.    Eyes: Negative.   Respiratory: Negative.    Cardiovascular: Negative.   Gastrointestinal: Negative.   Endocrine: Negative.   Genitourinary:  Negative.   Skin: Negative.   Allergic/Immunologic: Negative.   Neurological: Negative.   Hematological: Negative.   Psychiatric/Behavioral: Negative.    All other systems reviewed and are negative.    Objective: Vital Signs: There were no vitals taken for this visit.  Physical Exam Vitals and nursing note reviewed.  Constitutional:      Appearance: He is well-developed.  HENT:     Head: Normocephalic and atraumatic.  Eyes:     Pupils: Pupils are equal, round, and reactive to light.  Pulmonary:     Effort: Pulmonary effort is normal.  Abdominal:     Palpations: Abdomen is soft.  Musculoskeletal:        General: Normal range of motion.     Cervical back: Neck supple.  Skin:    General: Skin is warm.  Neurological:     Mental Status: He is alert and oriented to person, place, and time.  Psychiatric:        Behavior: Behavior normal.        Thought Content: Thought content normal.        Judgment: Judgment normal.     Ortho Exam Examination of right shoulder shows full passive and active range of motion.  Manual muscle testing of the rotator cuff shows 4-5 strength to supraspinatus.  He has pain with impingement and cross body.  AC joint is slightly tender. Specialty Comments:  No specialty comments available.  Imaging:  No results found.   PMFS History: Patient Active Problem List   Diagnosis Date Noted   Welcome to Medicare preventive visit 02/01/2023   Chronic right shoulder pain 01/10/2023   Eczema 01/10/2023   Elevated hemoglobin A1c 01/10/2023   Mixed hyperlipidemia 01/10/2023   Past Medical History:  Diagnosis Date   Chronic pain syndrome    DDD (degenerative disc disease), lumbar    Eczema    Hyperlipidemia    Kidney stones     No family history on file.  Past Surgical History:  Procedure Laterality Date   CIRCUMCISION     LITHOTRIPSY     Social History   Occupational History   Not on file  Tobacco Use   Smoking status: Never    Smokeless tobacco: Never  Vaping Use   Vaping Use: Never used  Substance and Sexual Activity   Alcohol use: No   Drug use: No   Sexual activity: Not Currently

## 2023-04-05 NOTE — Therapy (Addendum)
OUTPATIENT PHYSICAL THERAPY SHOULDER EVALUATION/Discharge   Patient Name: Stuart Hubbard MRN: [id number] DOB:October 11, 1955, 67 y.o., male Today's Date: 04/09/2023   END OF SESSION:  PT End of Session - 04/09/23 0802     Visit Number 1    Date for PT Re-Evaluation 05/21/23    Authorization Type [employer]    PT Start Time 0802    PT Stop Time 0848    PT Time Calculation (min) 46 min    Activity Tolerance Patient tolerated treatment well    Behavior During Therapy WFL for tasks assessed/performed             Past Medical History:  Diagnosis Date   Chronic pain syndrome    DDD (degenerative disc disease), lumbar    Eczema    Hyperlipidemia    Kidney stones    Past Surgical History:  Procedure Laterality Date   CIRCUMCISION     LITHOTRIPSY     Patient Active Problem List   Diagnosis Date Noted   Welcome to Medicare preventive visit 02/01/2023   Chronic right shoulder pain 01/10/2023   Eczema 01/10/2023   Elevated hemoglobin A1c 01/10/2023   Mixed hyperlipidemia 01/10/2023    PCP: Gerre Scull, NP   REFERRING PROVIDER: Tarry Kos, MD   REFERRING DIAG:  M75.41 (ICD-10-CM) - Impingement syndrome of right shoulder  M19.011 (ICD-10-CM) - Arthritis of right acromioclavicular joint   Eval and treat right shoulder pain, impingement, RC syndrome   THERAPY DIAG:  Acute pain of right shoulder  Stiffness of right shoulder, not elsewhere classified  Abnormal posture  Muscle weakness (generalized)  RATIONALE FOR EVALUATION AND TREATMENT: Rehabilitation  ONSET DATE: Dec 2023  NEXT MD VISIT: none scheduled, MD requesting recheck in about 6 weeks (~05/04/23)   SUBJECTIVE:                                                                                                                                                                                                         SUBJECTIVE STATEMENT: Pt reports he received an eczema injection in his R shoulder  ~09/13/22 which led to increased pain in his R shoulder as well as numbness in his R UE. X-rays only showed OA. After MRI, he was given a cortisone shot and his pain is mostly resolved other than some tenderness at the initial injection site and pain at end ROM.  PAIN: Are you having pain? No  PERTINENT HISTORY:  Chronic pain syndrome, lumbar DDD, HLD, kidney stones, eczema  PRECAUTIONS: None  HAND DOMINANCE: Right and Ambidextrous  WEIGHT  BEARING RESTRICTIONS: No  FALLS:  Has patient fallen in last 6 months? No  LIVING ENVIRONMENT: Lives with: lives with their daughter Lives in: House/apartment Stairs: Yes: External: 4 steps; can reach both Has following equipment at home: None  OCCUPATION: Research officer, trade [employer]; Artist  PLOF: Independent and Leisure: Tree surgeon, fishing, Artist (stopped ~1 month prior to injury)  PATIENT GOALS: "Keep the pain from coming back."   OBJECTIVE: (objective measures completed at initial evaluation unless otherwise dated)  DIAGNOSTIC FINDINGS:  03/11/23 - R shoulder MRI: IMPRESSION: 1. Severe tendinosis of the supraspinatus tendon with a large high-grade partial thickness, near full-thickness, articular surface tear. 2. Severe tendinosis of the infraspinatus tendon with a small interstitial tear. 3. Mild tendinosis of the subscapularis tendon. 4. Posterosuperior labral tear with a 6 mm paralabral cyst.  01/23/23 - R shoulder x-ray: IMPRESSION: Mild AC joint and glenohumeral degenerative change.  PATIENT SURVEYS:  Quick Dash 9.1 / 100 = 9.1 %  COGNITION: Overall cognitive status: Within functional limits for tasks assessed     SENSATION: WFL R arm previously numb upon rising in the morning with tingling at night - mostly resolved since cortisone injection  POSTURE: Mild rounded shoulders R>L  UPPER EXTREMITY ROM:  *R shoulder mildly limited with pain at end ROM for all movement patterns Active ROM Right * eval Left eval   Shoulder flexion 144 149  Shoulder extension 44 50  Shoulder abduction 131 146  Shoulder adduction    Shoulder internal rotation FIR to lateral hip FIR to L5  Shoulder external rotation FER WFL FER WFL  Elbow flexion    Elbow extension    Wrist flexion    Wrist extension    Wrist ulnar deviation    Wrist radial deviation    Wrist pronation    Wrist supination    (Blank rows = not tested)  UPPER EXTREMITY MMT: *Pain MMT Right eval Left eval  Shoulder flexion 5 * 5  Shoulder extension 5 5  Shoulder abduction 5 5  Shoulder adduction    Shoulder internal rotation 5 5  Shoulder external rotation 4 * 4+ *  Middle trapezius    Lower trapezius    Elbow flexion    Elbow extension    Wrist flexion    Wrist extension    Wrist ulnar deviation    Wrist radial deviation    Wrist pronation    Wrist supination    Stuart strength (lbs)    (Blank rows = not tested)  SHOULDER SPECIAL TESTS: Impingement tests: Neer impingement test: positive  and Hawkins/Kennedy impingement test: positive  Rotator cuff assessment: Empty can test: positive  and Full can test: negative  PALPATION:  TTP with taut bands and TPs over R anterolateral deltoid, teres group, subscapularis and infraspinatus    TODAY'S TREATMENT:   04/09/23 THERAPEUTIC EXERCISE: to improve flexibility, strength and mobility.  Demonstration, verbal and tactile cues throughout for technique.  Standing GTB scap retraction plus shoulder row 10 x 5" Sstanding GTB scap retraction + shoulder extension 10 x 5" Standing GTB B shoulder ER + scapular retraction 10 x 5  MANUAL THERAPY: To promote normalized muscle tension, improved flexibility, increased ROM, and reduced pain. Manual TPR to R anterior deltoid and infraspinatus   PATIENT EDUCATION:  Education details: PT eval findings, anticipated POC, and initial HEP  Person educated: Patient Education method: Explanation, Demonstration, Verbal cues, and Handouts Education  comprehension: verbalized understanding, returned demonstration, verbal cues required, and needs further education  HOME  EXERCISE PROGRAM: Access Code: [email or web address] URL: [email or web address] Date: 04/09/2023 Prepared by: Glenetta Hew  Exercises - Standing Shoulder Row with Anchored Resistance  - 1 x daily - 7 x weekly - 2 sets - 10 reps - 5 sec hold - Shoulder extension with resistance - Neutral  - 1 x daily - 7 x weekly - 2 sets - 10 reps - 5 sec hold - Shoulder External Rotation and Scapular Retraction with Resistance  - 1 x daily - 7 x weekly - 2 sets - 10 reps - 3-5 sec hold  Patient Education - Trigger Point Dry Needling   ASSESSMENT:  CLINICAL IMPRESSION: Stuart Hubbard is a 67 y.o. male who was seen today for physical therapy evaluation and treatment for R shoulder pain originating following an eczema injection received in early Dec 2023 for an eczema flare-up.  Imaging revealing high-grade degenerative changes of the supraspinatus and infraspinatus, posterior superior labral tear and severe AC joint arthritis and unfavorable acromial anatomy.  He received a cortisone injection following his MRI which has given him good relief of most of his pain but he is still TTP at the initial eczema injection site and demonstrates mildly R shoulder ROM with pain/discomfort at end ROM.  Only mild weakness noted in R>L shoulder with decreased postural muscle engagement evident, although pain with resisted flexion and external rotation.  Increased muscle tension noted on palpation with taut bands and trigger points evident in several muscles within the right shoulder complex.  Stuart Hubbard will benefit from skilled PT to address above deficits to improve mobility and activity tolerance with decreased pain interference.   OBJECTIVE IMPAIRMENTS: decreased activity tolerance, decreased ROM, decreased strength, hypomobility, increased fascial restrictions, increased muscle spasms, impaired flexibility,  impaired UE functional use, and pain.   ACTIVITY LIMITATIONS: lifting, bathing, reach over head, and hygiene/grooming  PARTICIPATION LIMITATIONS: cleaning and community activity  PERSONAL FACTORS: Past/current experiences, Time since onset of injury/illness/exacerbation, and 3+ comorbidities: Chronic pain syndrome, lumbar DDD, HLD, kidney stones, eczema  are also affecting patient's functional outcome.   REHAB POTENTIAL: Excellent  CLINICAL DECISION MAKING: Stable/uncomplicated  EVALUATION COMPLEXITY: Low   GOALS: Goals reviewed with patient? Yes  SHORT TERM GOALS: Target date: 04/30/2023  Patient will be independent with initial HEP to improve outcomes and carryover.  Baseline:  Goal status: INITIAL  LONG TERM GOALS: Target date: 05/21/2023   Patient will be independent with ongoing/advanced HEP for self-management at home.  Baseline:  Goal status: INITIAL  2.  Patient will report no worsening of R shoulder pain as cortisone injection wears off to improve QOL.  Baseline: Good relief of pain from cortisone injection Goal status: INITIAL  3.  Patient to demonstrate improved upright posture with posterior shoulder girdle engaged to promote improved glenohumeral joint mobility. Baseline: Forward head and mildly rounded shoulder posture R>L Goal status: INITIAL  4.  Patient to improve R shoulder AROM to WNL without pain provocation to allow for increased ease of ADLs.  Baseline: Mildly limited with pain at end ROM - refer to above UE ROM table Goal status: INITIAL  5.  Patient will demonstrate improved R shoulder strength to 5/5 w/o pain provocation for functional UE use. Baseline: Refer to above UE MMT table Goal status: INITIAL  6  Patient will report </= 5% on QuickDASH to demonstrate improved functional ability.  Baseline: 9.1 / 100 = 9.1 % Goal status: INITIAL  7.  Patient will report ability to resume working out at [employer] w/o limitation due to  R shoulder  pain.   Baseline: Pt has avoiding going back to gym due to R shoulder pain. Goal status: INITIAL    PLAN:  PT FREQUENCY: 1-2x/week  PT DURATION: 6 weeks  PLANNED INTERVENTIONS: Therapeutic exercises, Therapeutic activity, Neuromuscular re-education, Patient/Family education, Self Care, Joint mobilization, Dry Needling, Electrical stimulation, Cryotherapy, Moist heat, Taping, Ultrasound, Ionotophoresis 4mg /ml Dexamethasone, Manual therapy, and Re-evaluation  PLAN FOR NEXT SESSION: MT +/- DN to address abnormal muscle tension in R anterior lateral deltoid, infraspinatus, teres group, subscapularis, and other shoulder musculature as indicated; review initial HEP; R shoulder ROM/stretching; postural strengthening   Marry Guan, PT 04/09/2023, 12:39 PM  PHYSICAL THERAPY DISCHARGE SUMMARY  Visits from Start of Care: 1  Current functional level related to goals / functional outcomes: See above   Remaining deficits: See above   Education / Equipment: HEP  Plan: Patient goals were not met. Patient is being discharged due to not returning to PT after initial evaluation on 04/09/2023.      Jena Gauss, PT  05/23/2023 4:43 PM

## 2023-04-09 ENCOUNTER — Ambulatory Visit: Payer: Medicare HMO | Attending: Orthopaedic Surgery | Admitting: Physical Therapy

## 2023-04-09 ENCOUNTER — Encounter: Payer: Self-pay | Admitting: Physical Therapy

## 2023-04-09 ENCOUNTER — Other Ambulatory Visit: Payer: Self-pay

## 2023-04-09 DIAGNOSIS — M19011 Primary osteoarthritis, right shoulder: Secondary | ICD-10-CM | POA: Diagnosis not present

## 2023-04-09 DIAGNOSIS — R293 Abnormal posture: Secondary | ICD-10-CM | POA: Diagnosis present

## 2023-04-09 DIAGNOSIS — M7541 Impingement syndrome of right shoulder: Secondary | ICD-10-CM | POA: Diagnosis not present

## 2023-04-09 DIAGNOSIS — M6281 Muscle weakness (generalized): Secondary | ICD-10-CM | POA: Insufficient documentation

## 2023-04-09 DIAGNOSIS — M25511 Pain in right shoulder: Secondary | ICD-10-CM | POA: Diagnosis present

## 2023-04-09 DIAGNOSIS — M25611 Stiffness of right shoulder, not elsewhere classified: Secondary | ICD-10-CM | POA: Insufficient documentation

## 2023-04-18 ENCOUNTER — Ambulatory Visit: Payer: Medicare HMO | Admitting: Physical Therapy

## 2023-04-20 ENCOUNTER — Ambulatory Visit: Payer: Medicare HMO | Admitting: Physical Therapy

## 2023-05-01 ENCOUNTER — Ambulatory Visit: Payer: Medicare HMO | Admitting: Physical Therapy

## 2023-05-02 ENCOUNTER — Encounter: Payer: Self-pay | Admitting: Nurse Practitioner

## 2023-05-02 ENCOUNTER — Ambulatory Visit (INDEPENDENT_AMBULATORY_CARE_PROVIDER_SITE_OTHER): Payer: Medicare HMO | Admitting: Nurse Practitioner

## 2023-05-02 VITALS — BP 114/62 | HR 56 | Temp 98.4°F | Ht 69.0 in | Wt 199.0 lb

## 2023-05-02 DIAGNOSIS — G8929 Other chronic pain: Secondary | ICD-10-CM

## 2023-05-02 DIAGNOSIS — M25511 Pain in right shoulder: Secondary | ICD-10-CM

## 2023-05-02 DIAGNOSIS — E119 Type 2 diabetes mellitus without complications: Secondary | ICD-10-CM

## 2023-05-02 LAB — POCT GLYCOSYLATED HEMOGLOBIN (HGB A1C)
HbA1c POC (<> result, manual entry): 8.1 % (ref 4.0–5.6)
HbA1c, POC (controlled diabetic range): 8.1 % — AB (ref 0.0–7.0)
HbA1c, POC (prediabetic range): 8.1 % — AB (ref 5.7–6.4)
Hemoglobin A1C: 8.1 % (ref 4.0–5.6)

## 2023-05-02 MED ORDER — BLOOD GLUCOSE MONITORING SUPPL DEVI: 1.0000 | Freq: Every day | 0 refills | Status: AC

## 2023-05-02 MED ORDER — LANCETS MISC. MISC: 1.0000 | Freq: Every day | 0 refills | Status: AC

## 2023-05-02 MED ORDER — BLOOD GLUCOSE TEST VI STRP: 1.0000 | ORAL_STRIP | Freq: Every day | 0 refills | Status: AC

## 2023-05-02 NOTE — Progress Notes (Addendum)
Established Patient Office Visit  Subjective   Patient ID: Stuart Hubbard, male    DOB: 07/23/1956  Age: 67 y.o. MRN: [id number]  Chief Complaint  Patient presents with   Shoulder Pain    Follow up    HPI  Stuart Hubbard is here to follow-up on shoulder pain and new onset diabetes.   He states that after going to orthopedics and getting a steroid injection in his shoulder, his symptoms have improved significantly.  He also did 1 physical therapy session and has been doing stretches at home since.  He is not having any pain currently.  He is still doing the stretches at home daily.  He states that he has changed his diet significantly and is trying to limit sugars in his diet.  He has not checked his blood sugars at home because he does not have a meter.  He denies chest pain, shortness of breath, numbness and tingling in his feet.  He states that he is planning on going to the eye doctor soon.    ROS See pertinent positives and negatives per HPI.    Objective:     BP 114/62 (BP Location: Left Arm)   Pulse (!) 56   Temp 98.4 F (36.9 C)   Ht 5\' 9"  (1.753 m)   Wt 199 lb (90.3 kg)   SpO2 98%   BMI 29.39 kg/m  BP Readings from Last 3 Encounters:  05/02/23 114/62  01/31/23 128/80  01/10/23 138/84   Wt Readings from Last 3 Encounters:  05/02/23 199 lb (90.3 kg)  01/31/23 195 lb 9.6 oz (88.7 kg)  01/10/23 197 lb (89.4 kg)      Physical Exam Vitals and nursing note reviewed.  Constitutional:      Appearance: Normal appearance.  HENT:     Head: Normocephalic.  Eyes:     Conjunctiva/sclera: Conjunctivae normal.  Cardiovascular:     Rate and Rhythm: Normal rate and regular rhythm.     Pulses: Normal pulses.     Heart sounds: Normal heart sounds.  Pulmonary:     Effort: Pulmonary effort is normal.     Breath sounds: Normal breath sounds.  Musculoskeletal:     Cervical back: Normal range of motion.  Skin:    General: Skin is warm.  Neurological:     General: No  focal deficit present.     Mental Status: He is alert and oriented to person, place, and time.  Psychiatric:        Mood and Affect: Mood normal.        Behavior: Behavior normal.        Thought Content: Thought content normal.        Judgment: Judgment normal.    The 10-year ASCVD risk score (Arnett DK, et al., 2019) is: 16.9%    Assessment & Plan:   Problem List Items Addressed This Visit       Endocrine   Type 2 diabetes mellitus without complication, with no history of insulin use (HCC) - Primary    Chronic, not controlled.  A1c is still 8.1%.  Will have him start checking his blood sugars at home a few times a week fasting.  Recommend that he start the metformin XL 500 mg daily as he wanted to try diet changes first.  Recommend that he get a yearly eye exam.  Handout given on nutrition and diabetes.  Will have him follow-up in 6 months.  Will discuss statin at next visit.  He  would like to hold off on pneumonia vaccine and get this next visit as well.      Relevant Orders   POCT glycosylated hemoglobin (Hb A1C)     Other   Chronic right shoulder pain    His pain has resolved since going to the orthopedic and getting a injection in his right shoulder and starting physical therapy.  Encouraged him to continue doing the stretches at home.  Follow-up with any concerns.       Return in about 6 months (around 11/02/2023) for Diabetes.    Gerre Scull, NP

## 2023-05-02 NOTE — Assessment & Plan Note (Signed)
His pain has resolved since going to the orthopedic and getting a injection in his right shoulder and starting physical therapy.  Encouraged him to continue doing the stretches at home.  Follow-up with any concerns.

## 2023-05-02 NOTE — Addendum Note (Signed)
Addended by: Rodman Pickle A on: 05/02/2023 09:30 AM   Modules accepted: Orders

## 2023-05-02 NOTE — Patient Instructions (Addendum)
It was great to see you!  Keep up the great work with you diet changes.   I am sending in a glucometer to your pharmacy so you can check your sugars at home. Check them in the morning before you eat.   I recommend a yearly eye exam and let them know you have diabetes.   Let's follow-up in 6 months, sooner if you have concerns.  If a referral was placed today, you will be contacted for an appointment. Please note that routine referrals can sometimes take up to 3-4 weeks to process. Please call our office if you haven't heard anything after this time frame.  Take care,  Rodman Pickle, NP

## 2023-05-02 NOTE — Assessment & Plan Note (Signed)
Chronic, not controlled.  A1c is still 8.1%.  Will have him start checking his blood sugars at home a few times a week fasting.  Recommend that he start the metformin XL 500 mg daily as he wanted to try diet changes first.  Recommend that he get a yearly eye exam.  Handout given on nutrition and diabetes.  Will have him follow-up in 6 months.  Will discuss statin at next visit.  He would like to hold off on pneumonia vaccine and get this next visit as well.

## 2023-05-03 ENCOUNTER — Ambulatory Visit: Payer: Medicare HMO | Admitting: Physical Therapy

## 2023-05-11 ENCOUNTER — Encounter: Payer: Medicare HMO | Admitting: Physical Therapy

## 2023-05-15 ENCOUNTER — Encounter: Payer: Self-pay | Admitting: Physical Therapy

## 2023-05-21 ENCOUNTER — Encounter: Payer: Self-pay | Admitting: Physical Therapy

## 2023-09-17 ENCOUNTER — Other Ambulatory Visit (HOSPITAL_COMMUNITY): Payer: Self-pay

## 2023-11-07 ENCOUNTER — Telehealth: Payer: Self-pay

## 2023-11-07 NOTE — Telephone Encounter (Signed)
LVM for pt to call or schedule an office visit on MyChart for a DM f/u appt.

## 2023-12-10 NOTE — Telephone Encounter (Signed)
 Patient was identified as falling into the [place] Measure - Diabetes.   Patient was: Patient refuses intervention.  No longer PCP. Rodman Pickle removed from chart as PCP.
